# Patient Record
Sex: Female | Born: 1975 | Race: White | Hispanic: No | Marital: Married | Smoking: Never smoker
Health system: Southern US, Community
[De-identification: ages and names within clinical notes are randomized; demographics above are authoritative.]

## PROBLEM LIST (undated history)

## (undated) DIAGNOSIS — N939 Abnormal uterine and vaginal bleeding, unspecified: Secondary | ICD-10-CM

## (undated) HISTORY — DX: Abnormal uterine and vaginal bleeding, unspecified: N93.9

---
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
## 2001-04-04 HISTORY — PX: MASS EXCISION: SHX2000

## 2008-01-31 DIAGNOSIS — J45909 Unspecified asthma, uncomplicated: Secondary | ICD-10-CM | POA: Insufficient documentation

## 2008-04-28 ENCOUNTER — Ambulatory Visit: Payer: Self-pay

## 2008-04-29 ENCOUNTER — Inpatient Hospital Stay: Payer: Self-pay

## 2008-12-05 HISTORY — PX: ABDOMINAL HYSTERECTOMY: SHX81

## 2009-09-08 ENCOUNTER — Ambulatory Visit: Payer: Self-pay | Admitting: Family Medicine

## 2009-10-20 ENCOUNTER — Ambulatory Visit: Payer: Self-pay | Admitting: Orthopedic Surgery

## 2009-11-02 ENCOUNTER — Ambulatory Visit: Payer: Self-pay

## 2009-11-12 ENCOUNTER — Ambulatory Visit: Payer: Self-pay

## 2009-12-28 ENCOUNTER — Ambulatory Visit: Payer: Self-pay | Admitting: Orthopedic Surgery

## 2010-02-23 ENCOUNTER — Encounter: Payer: Self-pay | Admitting: Orthopedic Surgery

## 2010-03-05 ENCOUNTER — Encounter: Payer: Self-pay | Admitting: Orthopedic Surgery

## 2011-05-13 ENCOUNTER — Ambulatory Visit: Payer: Self-pay | Admitting: Internal Medicine

## 2011-08-29 LAB — CBC AND DIFFERENTIAL
HEMATOCRIT: 40 % (ref 36–46)
Hemoglobin: 13.5 g/dL (ref 12.0–16.0)
PLATELETS: 381 10*3/uL (ref 150–399)
WBC: 9.7 10*3/mL

## 2011-08-30 LAB — TSH: TSH: 1.22 u[IU]/mL (ref 0.41–5.90)

## 2011-08-30 LAB — LIPID PANEL
CHOLESTEROL: 154 mg/dL (ref 0–200)
HDL: 45 mg/dL (ref 35–70)
LDL CALC: 91 mg/dL
TRIGLYCERIDES: 90 mg/dL (ref 40–160)

## 2011-08-30 LAB — BASIC METABOLIC PANEL
BUN: 14 mg/dL (ref 4–21)
Creatinine: 0.5 mg/dL (ref 0.5–1.1)
Glucose: 81 mg/dL
Potassium: 3.9 mmol/L (ref 3.4–5.3)
Sodium: 137 mmol/L (ref 137–147)

## 2011-08-30 LAB — HEPATIC FUNCTION PANEL
ALT: 18 U/L (ref 7–35)
AST: 14 U/L (ref 13–35)

## 2011-09-26 ENCOUNTER — Ambulatory Visit: Payer: Self-pay

## 2012-12-26 ENCOUNTER — Ambulatory Visit: Payer: Self-pay | Admitting: Family Medicine

## 2013-02-06 ENCOUNTER — Ambulatory Visit: Payer: Self-pay | Admitting: Family Medicine

## 2013-03-17 ENCOUNTER — Ambulatory Visit: Payer: Self-pay | Admitting: Physician Assistant

## 2013-03-18 ENCOUNTER — Ambulatory Visit: Payer: Self-pay | Admitting: Otolaryngology

## 2014-09-15 ENCOUNTER — Ambulatory Visit: Payer: Self-pay | Admitting: Family Medicine

## 2015-07-14 ENCOUNTER — Other Ambulatory Visit: Payer: Self-pay | Admitting: Unknown Physician Specialty

## 2015-07-14 DIAGNOSIS — N63 Unspecified lump in unspecified breast: Secondary | ICD-10-CM

## 2015-07-17 ENCOUNTER — Ambulatory Visit
Admission: RE | Admit: 2015-07-17 | Discharge: 2015-07-17 | Disposition: A | Discharge status: Home / Self Care | Payer: BC Managed Care – PPO | Source: Ambulatory Visit | Attending: Unknown Physician Specialty | Admitting: Unknown Physician Specialty

## 2015-07-17 ENCOUNTER — Ambulatory Visit: Payer: Self-pay

## 2015-07-17 DIAGNOSIS — N6002 Solitary cyst of left breast: Secondary | ICD-10-CM | POA: Insufficient documentation

## 2015-07-17 DIAGNOSIS — N63 Unspecified lump in unspecified breast: Secondary | ICD-10-CM

## 2015-07-23 ENCOUNTER — Encounter: Payer: Self-pay | Admitting: *Deleted

## 2015-07-23 ENCOUNTER — Ambulatory Visit: Payer: Self-pay | Admitting: General Surgery

## 2015-10-23 ENCOUNTER — Encounter: Payer: Self-pay | Admitting: Family Medicine

## 2015-10-23 ENCOUNTER — Ambulatory Visit (INDEPENDENT_AMBULATORY_CARE_PROVIDER_SITE_OTHER): Payer: BC Managed Care – PPO | Admitting: Family Medicine

## 2015-10-23 VITALS — BP 116/68 | HR 84 | Temp 98.5°F | Resp 16 | Ht 61.0 in | Wt 211.0 lb

## 2015-10-23 DIAGNOSIS — R05 Cough: Secondary | ICD-10-CM

## 2015-10-23 DIAGNOSIS — J0110 Acute frontal sinusitis, unspecified: Secondary | ICD-10-CM

## 2015-10-23 DIAGNOSIS — G43909 Migraine, unspecified, not intractable, without status migrainosus: Secondary | ICD-10-CM | POA: Insufficient documentation

## 2015-10-23 DIAGNOSIS — T7840XA Allergy, unspecified, initial encounter: Secondary | ICD-10-CM | POA: Insufficient documentation

## 2015-10-23 DIAGNOSIS — E669 Obesity, unspecified: Secondary | ICD-10-CM | POA: Insufficient documentation

## 2015-10-23 DIAGNOSIS — B029 Zoster without complications: Secondary | ICD-10-CM | POA: Insufficient documentation

## 2015-10-23 DIAGNOSIS — R059 Cough, unspecified: Secondary | ICD-10-CM

## 2015-10-23 MED ORDER — HYDROCODONE-HOMATROPINE 5-1.5 MG/5ML PO SYRP: 5.0000 mL | ORAL_SOLUTION | Freq: Four times a day (QID) | ORAL | Status: DC | PRN

## 2015-10-23 NOTE — Progress Notes (Signed)
Patient ID: Erica Cook, female   DOB: December 16, 1975, 39 y.o.   MRN: [id number]         Patient: Erica Cook Female    DOB: 06/16/1976   39 y.o.   MRN: [id number] Visit Date: 10/23/2015  Today's Provider: Margarita Rana, MD   Chief Complaint  Patient presents with  . Sinusitis   Subjective:    Sinusitis This is a new problem. The current episode started in the past 7 days. The problem has been gradually worsening since onset. There has been no fever. Associated symptoms include congestion, coughing, headaches, sinus pressure, sneezing and a sore throat. Pertinent negatives include no chills, diaphoresis, ear pain, neck pain, shortness of breath or swollen glands. Past treatments include spray decongestants and oral decongestants. The treatment provided mild relief.       Allergies  Allergen Reactions  . Milk Protein Swelling  . Shellfish Allergy Swelling  . Wheat Bran Swelling   Previous Medications   EPINEPHRINE (EPIPEN 2-PAK) 0.3 MG/0.3 ML IJ SOAJ INJECTION    EPIPEN 2-PAK, 0.3MG /0.3ML (Injection Solution Auto-injector)  1 (one) Soln Auto-inj Soln Auto-inj as directed. for 0 days  Quantity: 2;  Refills: 2   Ordered :11-Dec-2014  [name] ;  Started 11-Dec-2014 Active   FEXOFENADINE [facility] ALLERGY) 180 MG TABLET    Take by mouth.   FLUTICASONE (FLONASE) 50 MCG/ACT NASAL SPRAY    Place into the nose.   HYDROCODONE-HOMATROPINE (HYCODAN) 5-1.5 MG/5ML SYRUP    Take 5 mLs by mouth every 6 (six) hours as needed for cough.   MONTELUKAST (SINGULAIR) 10 MG TABLET    Take by mouth.    Review of Systems  Constitutional: Positive for fatigue. Negative for fever, chills, diaphoresis, activity change, appetite change and unexpected weight change.  HENT: Positive for congestion, ear discharge, postnasal drip, rhinorrhea, sinus pressure, sneezing, sore throat, tinnitus and voice change. Negative for ear pain, facial swelling, nosebleeds and trouble swallowing.   Eyes: Negative for  photophobia, pain, discharge, redness, itching and visual disturbance.  Respiratory: Positive for cough and wheezing (Pt noticed it yesterday. ). Negative for apnea, choking, chest tightness, shortness of breath and stridor.   Cardiovascular: Negative.   Gastrointestinal: Negative.   Musculoskeletal: Negative for neck pain.  Neurological: Positive for headaches. Negative for dizziness and light-headedness.    Social History  Substance Use Topics  . Smoking status: Never Smoker   . Smokeless tobacco: Never Used  . Alcohol Use: No   Objective:   BP 116/68 mmHg  Pulse 84  Temp(Src) 98.5 F (36.9 C) (Oral)  Resp 16  Ht 5\' 1"  (1.549 m)  Wt 211 lb (95.709 kg)  BMI 39.89 kg/m2  SpO2 97%  Physical Exam  Constitutional: She is oriented to person, place, and time. She appears well-developed and well-nourished.  HENT:  Head: Normocephalic and atraumatic.  Right Ear: External ear normal.  Left Ear: External ear normal.  Nose: Mucosal edema and rhinorrhea present.  Mouth/Throat: Oropharynx is clear and moist.  Eyes: Conjunctivae and EOM are normal. Pupils are equal, round, and reactive to light.  Neck: Normal range of motion. Neck supple.  Cardiovascular: Normal rate and regular rhythm.   Pulmonary/Chest: Effort normal and breath sounds normal.  Neurological: She is alert and oriented to person, place, and time.  Psychiatric: She has a normal mood and affect. Her behavior is normal. Judgment and thought content normal.       Assessment & Plan:     1. Acute frontal  sinusitis, recurrence not specified New problem. Unclear if viral or bacterial.  Gave rx  For Augmentin to fill if needed.  Will call if starts rx and will add to her chart.   2. Cough Condition is worsening. Will start medication for better control.  Patient instructed to call back if condition worsens or does not improve.    - HYDROcodone-homatropine (HYCODAN) 5-1.5 MG/5ML syrup; Take 5 mLs by mouth every 6 (six) hours  as needed for cough.  Dispense: 120 mL; Refill: 0       Margarita Rana, MD  [facility]

## 2016-03-28 ENCOUNTER — Other Ambulatory Visit: Payer: Self-pay | Admitting: Family Medicine

## 2016-03-28 DIAGNOSIS — R059 Cough, unspecified: Secondary | ICD-10-CM

## 2016-03-28 DIAGNOSIS — R05 Cough: Secondary | ICD-10-CM

## 2016-03-28 MED ORDER — ALBUTEROL SULFATE HFA 108 (90 BASE) MCG/ACT IN AERS: 2.0000 | INHALATION_SPRAY | Freq: Four times a day (QID) | RESPIRATORY_TRACT | Status: DC | PRN

## 2016-03-28 MED ORDER — HYDROCODONE-HOMATROPINE 5-1.5 MG/5ML PO SYRP: 5.0000 mL | ORAL_SOLUTION | Freq: Four times a day (QID) | ORAL | Status: DC | PRN

## 2016-03-28 NOTE — Telephone Encounter (Signed)
Sent inhaler. Ok to pick up cough rx. Needs to use sparingly, can be addictive. Thanks.

## 2016-03-28 NOTE — Telephone Encounter (Signed)
Pt also called in for the refill on her cough rx.  She also would like an emergency inhaler.  Hers has expired.  She uses Oncologist in [place]

## 2016-08-01 ENCOUNTER — Other Ambulatory Visit: Payer: Self-pay | Admitting: Family Medicine

## 2016-08-01 DIAGNOSIS — R059 Cough, unspecified: Secondary | ICD-10-CM

## 2016-08-01 DIAGNOSIS — R05 Cough: Secondary | ICD-10-CM

## 2016-08-02 NOTE — Telephone Encounter (Signed)
This cough medication is a schedule II narcotic. It cannot prescribed without patient being seen and examined.

## 2016-10-17 ENCOUNTER — Other Ambulatory Visit: Payer: Self-pay | Admitting: Family Medicine

## 2016-10-17 ENCOUNTER — Encounter: Payer: Self-pay | Admitting: Physician Assistant

## 2016-10-17 ENCOUNTER — Ambulatory Visit (INDEPENDENT_AMBULATORY_CARE_PROVIDER_SITE_OTHER): Payer: BC Managed Care – PPO | Admitting: Physician Assistant

## 2016-10-17 VITALS — BP 120/78 | HR 85 | Temp 98.0°F | Resp 16 | Wt 221.0 lb

## 2016-10-17 DIAGNOSIS — J019 Acute sinusitis, unspecified: Secondary | ICD-10-CM | POA: Diagnosis not present

## 2016-10-17 DIAGNOSIS — M778 Other enthesopathies, not elsewhere classified: Secondary | ICD-10-CM | POA: Diagnosis not present

## 2016-10-17 MED ORDER — AMOXICILLIN-POT CLAVULANATE 875-125 MG PO TABS: 1.0000 | ORAL_TABLET | Freq: Two times a day (BID) | ORAL | 0 refills | Status: AC

## 2016-10-17 MED ORDER — AMOXICILLIN-POT CLAVULANATE 875-125 MG PO TABS: 1.0000 | ORAL_TABLET | Freq: Two times a day (BID) | ORAL | 0 refills | Status: DC

## 2016-10-17 NOTE — Progress Notes (Signed)
Patient: Erica Cook Female    DOB: March 30, 1976   40 y.o.   MRN: [id number] Visit Date: 10/17/2016  Today's Provider: Trinna Post, PA-C   Chief Complaint  Patient presents with  . Elbow Pain   Subjective:    HPI Elbow Pain: Patient complains of left elbow pain. Onset of the symptoms was yesterday. Inciting event: none known. Current symptoms include pain radiating to the hand. Pain is aggravated by: grasping. There is pain in her lateral elbow upon grasping. Patient's overall course: unchanged. Patient has had no prior elbow problems. No numbness or tingling, no weakness. No problems in her right elbow. Has not tried anything for it.   Upper Respiratory Infection: Patient complains of symptoms of a URI. Symptoms include bilateral ear drainage  and congestion. Onset of symptoms was 4 days ago, gradually worsening since that time. She also c/o congestion, nasal congestion, no  fever, post nasal drip and productive cough with  yellow and green colored sputum for the past 4 days .  She is drinking plenty of fluids. Patient reports she usually gets sinus infection this time of year.       Allergies  Allergen Reactions  . Milk Protein Swelling  . Shellfish Allergy Swelling  . Wheat Bran Swelling     Current Outpatient Prescriptions:  .  albuterol (PROVENTIL HFA;VENTOLIN HFA) 108 (90 Base) MCG/ACT inhaler, Inhale 2 puffs into the lungs every 6 (six) hours as needed for wheezing or shortness of breath., Disp: 1 Inhaler, Rfl: 2 .  EPINEPHrine (EPIPEN 2-PAK) 0.3 mg/0.3 mL IJ SOAJ injection, EPIPEN 2-PAK, 0.3MG /0.3ML (Injection Solution Auto-injector)  1 (one) Soln Auto-inj Soln Auto-inj as directed. for 0 days  Quantity: 2;  Refills: 2   Ordered :11-Dec-2014  [name] ;  Started 11-Dec-2014 Active, Disp: , Rfl:  .  fexofenadine (ALLEGRA ALLERGY) 180 MG tablet, Take by mouth., Disp: , Rfl:  .  montelukast (SINGULAIR) 10 MG tablet, Take by mouth., Disp: , Rfl:  .  fluticasone  (FLONASE) 50 MCG/ACT nasal spray, Place into the nose., Disp: , Rfl:  .  HYDROcodone-homatropine (HYCODAN) 5-1.5 MG/5ML syrup, Take 5 mLs by mouth every 6 (six) hours as needed for cough. (Patient not taking: Reported on 10/17/2016), Disp: 120 mL, Rfl: 0  Review of Systems  Constitutional: Negative for chills and fever.  HENT: Positive for congestion, ear discharge, postnasal drip, rhinorrhea, sinus pain, sinus pressure and voice change.   Respiratory: Positive for cough and wheezing (some Thursday and Friday). Negative for choking and chest tightness.   Cardiovascular: Negative for chest pain, palpitations and leg swelling.  Neurological: Positive for headaches. Negative for dizziness and light-headedness.    Social History  Substance Use Topics  . Smoking status: Never Smoker  . Smokeless tobacco: Never Used  . Alcohol use No   Objective:   BP 120/78 (BP Location: Right Arm, Patient Position: Sitting, Cuff Size: Normal)   Pulse 85   Temp 98 F (36.7 C) (Oral)   Resp 16   Wt 221 lb (100.2 kg)   SpO2 98%   BMI 41.76 kg/m   Physical Exam  Constitutional: She is oriented to person, place, and time. She appears well-developed and well-nourished. No distress.  HENT:  Right Ear: External ear normal.  Left Ear: External ear normal.  Nose: Right sinus exhibits no maxillary sinus tenderness and no frontal sinus tenderness. Left sinus exhibits no maxillary sinus tenderness and no frontal sinus tenderness.  Mouth/Throat: Oropharynx  is clear and moist. No oropharyngeal exudate, posterior oropharyngeal edema or posterior oropharyngeal erythema.  Tms opaque bilaterally   Eyes: Conjunctivae are normal. Right eye exhibits no discharge. Left eye exhibits no discharge.  Neck: Neck supple.  Cardiovascular: Normal rate and regular rhythm.   Pulmonary/Chest: Effort normal and breath sounds normal. No respiratory distress. She has no wheezes. She has no rales.  Musculoskeletal:       Right  shoulder: Normal.       Left shoulder: Normal.       Right elbow: Tenderness found.       Left elbow: Normal.       Right wrist: Normal.       Left wrist: Normal.  Bilateral 5/5 upper extremity strength. Distal sensation grossly in tact. Pain in left lateral elbow with grasping motion. Tenderness to palpation in left lateral elbow region.   Lymphadenopathy:    She has no cervical adenopathy.  Neurological: She is alert and oriented to person, place, and time.  Skin: Skin is warm and dry. She is not diaphoretic.  Psychiatric: She has a normal mood and affect. Her behavior is normal.        Assessment & Plan:      Problem List Items Addressed This Visit    None    Visit Diagnoses    Acute non-recurrent sinusitis, unspecified location    -  Primary   Relevant Medications   amoxicillin-clavulanate (AUGMENTIN) 875-125 MG tablet   Tendonitis of elbow, left         Patient given Augmentin hard script if after four more days of symptoms she continues to worsen.   Patient presenting with tendonitis of her elbow. Instructed to ice and use NSAID consistently for 1-2 weeks. Not necessary to immobilize. Follow up if no improvement.   Patient Instructions  Sinusitis, Adult Sinusitis is redness, soreness, and inflammation of the paranasal sinuses. Paranasal sinuses are air pockets within the bones of your face. They are located beneath your eyes, in the middle of your forehead, and above your eyes. In healthy paranasal sinuses, mucus is able to drain out, and air is able to circulate through them by way of your nose. However, when your paranasal sinuses are inflamed, mucus and air can become trapped. This can allow bacteria and other germs to grow and cause infection. Sinusitis can develop quickly and last only a short time (acute) or continue over a long period (chronic). Sinusitis that lasts for more than 12 weeks is considered chronic. CAUSES Causes of sinusitis  include:  Allergies.  Structural abnormalities, such as displacement of the cartilage that separates your nostrils (deviated septum), which can decrease the air flow through your nose and sinuses and affect sinus drainage.  Functional abnormalities, such as when the small hairs (cilia) that line your sinuses and help remove mucus do not work properly or are not present. SIGNS AND SYMPTOMS Symptoms of acute and chronic sinusitis are the same. The primary symptoms are pain and pressure around the affected sinuses. Other symptoms include:  Upper toothache.  Earache.  Headache.  Bad breath.  Decreased sense of smell and taste.  A cough, which worsens when you are lying flat.  Fatigue.  Fever.  Thick drainage from your nose, which often is green and may contain pus (purulent).  Swelling and warmth over the affected sinuses. DIAGNOSIS Your health care provider will perform a physical exam. During your exam, your health care provider may perform any of the following to help  determine if you have acute sinusitis or chronic sinusitis:  Look in your nose for signs of abnormal growths in your nostrils (nasal polyps).  Tap over the affected sinus to check for signs of infection.  View the inside of your sinuses using an imaging device that has a light attached (endoscope). If your health care provider suspects that you have chronic sinusitis, one or more of the following tests may be recommended:  Allergy tests.  Nasal culture. A sample of mucus is taken from your nose, sent to a lab, and screened for bacteria.  Nasal cytology. A sample of mucus is taken from your nose and examined by your health care provider to determine if your sinusitis is related to an allergy. TREATMENT Most cases of acute sinusitis are related to a viral infection and will resolve on their own within 10 days. Sometimes, medicines are prescribed to help relieve symptoms of both acute and chronic sinusitis.  These may include pain medicines, decongestants, nasal steroid sprays, or saline sprays. However, for sinusitis related to a bacterial infection, your health care provider will prescribe antibiotic medicines. These are medicines that will help kill the bacteria causing the infection. Rarely, sinusitis is caused by a fungal infection. In these cases, your health care provider will prescribe antifungal medicine. For some cases of chronic sinusitis, surgery is needed. Generally, these are cases in which sinusitis recurs more than 3 times per year, despite other treatments. HOME CARE INSTRUCTIONS  Drink plenty of water. Water helps thin the mucus so your sinuses can drain more easily.  Use a humidifier.  Inhale steam 3-4 times a day (for example, sit in the bathroom with the shower running).  Apply a warm, moist washcloth to your face 3-4 times a day, or as directed by your health care provider.  Use saline nasal sprays to help moisten and clean your sinuses.  Take medicines only as directed by your health care provider.  If you were prescribed either an antibiotic or antifungal medicine, finish it all even if you start to feel better. SEEK IMMEDIATE MEDICAL CARE IF:  You have increasing pain or severe headaches.  You have nausea, vomiting, or drowsiness.  You have swelling around your face.  You have vision problems.  You have a stiff neck.  You have difficulty breathing.   This information is not intended to replace advice given to you by your health care provider. Make sure you discuss any questions you have with your health care provider.   Document Released: 11/21/2005 Document Revised: 12/12/2014 Document Reviewed: 12/06/2011 Elsevier Interactive Patient Education [employer].   Return if symptoms worsen or fail to improve.  The entirety of the information documented in the History of Present Illness, Review of Systems and Physical Exam were personally obtained by  me. Portions of this information were initially documented by Joseline and reviewed by me for thoroughness and accuracy.            Trinna Post, PA-C  [facility]

## 2016-10-17 NOTE — Patient Instructions (Signed)

## 2016-10-20 ENCOUNTER — Other Ambulatory Visit: Payer: Self-pay | Admitting: Physician Assistant

## 2016-10-20 NOTE — Telephone Encounter (Signed)
Patient has been advised. KW 

## 2016-10-20 NOTE — Telephone Encounter (Signed)
Pt was in Monday and was given an antibiotic but she thinks she needs the advair.  Dr. Venia Minks usually gave her samples but if you need to call one in she uses [facility] in [place].   Pt's call back is [phone]  Thanks Con Memos

## 2016-10-20 NOTE — Telephone Encounter (Signed)
Advair is a long term maintenance medication and will not be effective for short term symptoms. Patient may use her albuterol inhaler every four hours consistently for the next three days, as this will help acute symptoms.

## 2016-11-03 ENCOUNTER — Encounter: Payer: Self-pay | Admitting: Physician Assistant

## 2016-11-03 ENCOUNTER — Ambulatory Visit
Admission: RE | Admit: 2016-11-03 | Discharge: 2016-11-03 | Disposition: A | Discharge status: Home / Self Care | Payer: BC Managed Care – PPO | Source: Ambulatory Visit | Attending: Physician Assistant | Admitting: Physician Assistant

## 2016-11-03 ENCOUNTER — Ambulatory Visit (INDEPENDENT_AMBULATORY_CARE_PROVIDER_SITE_OTHER): Payer: BC Managed Care – PPO | Admitting: Physician Assistant

## 2016-11-03 VITALS — BP 132/80 | HR 84 | Temp 98.5°F | Resp 16 | Wt 222.0 lb

## 2016-11-03 DIAGNOSIS — M25562 Pain in left knee: Secondary | ICD-10-CM

## 2016-11-03 NOTE — Patient Instructions (Signed)

## 2016-11-03 NOTE — Progress Notes (Signed)
Patient: Erica Cook Female    DOB: 13-Jan-1976   40 y.o.   MRN: [id number] Visit Date: 11/03/2016  Today's Provider: Trinna Post, PA-C   Chief Complaint  Patient presents with  . Knee Pain    Left knee pain.    Subjective:    Knee Pain   The incident occurred more than 1 week ago. There was no injury mechanism. The pain is present in the left knee. The quality of the pain is described as aching. The pain is at a severity of 3/10 (Can be as high as 6/10). Associated symptoms include muscle weakness (Only happened once. ). Pertinent negatives include no inability to bear weight, loss of motion, loss of sensation, numbness or tingling. Exacerbated by: Worse when sitting for a little while and then stands up. She has tried NSAIDs for the symptoms. The treatment provided mild relief.   The patient describes improvement in pain with standing. She has been using Ibuprofen 200 mg as needed.     Allergies  Allergen Reactions  . Milk Protein Swelling  . Shellfish Allergy Swelling  . Wheat Bran Swelling     Current Outpatient Prescriptions:  .  albuterol (PROVENTIL HFA;VENTOLIN HFA) 108 (90 Base) MCG/ACT inhaler, Inhale 2 puffs into the lungs every 6 (six) hours as needed for wheezing or shortness of breath., Disp: 1 Inhaler, Rfl: 2 .  EPINEPHRINE 0.3 mg/0.3 mL IJ SOAJ injection, AS DIRECTED, Disp: 2 Device, Rfl: 0 .  fexofenadine (ALLEGRA ALLERGY) 180 MG tablet, Take by mouth., Disp: , Rfl:  .  fluticasone (FLONASE) 50 MCG/ACT nasal spray, Place into the nose., Disp: , Rfl:  .  montelukast (SINGULAIR) 10 MG tablet, Take by mouth., Disp: , Rfl:   Review of Systems  Constitutional: Negative.   Musculoskeletal: Positive for arthralgias. Negative for back pain, gait problem, joint swelling, myalgias, neck pain and neck stiffness.  Neurological: Negative for tingling and numbness.    Social History  Substance Use Topics  . Smoking status: Never Smoker  . Smokeless  tobacco: Never Used  . Alcohol use No   Objective:   BP 132/80 (BP Location: Left Arm, Patient Position: Sitting, Cuff Size: Large)   Pulse 84   Temp 98.5 F (36.9 C) (Oral)   Resp 16   Wt 222 lb (100.7 kg)   BMI 41.95 kg/m   Physical Exam  Constitutional: She is oriented to person, place, and time. She appears well-developed and well-nourished.  Cardiovascular: Normal rate.   Pulmonary/Chest: Effort normal.  Musculoskeletal: Normal range of motion. She exhibits edema and tenderness. She exhibits no deformity.       Right knee: Normal.       Left knee: She exhibits LCL laxity. She exhibits normal range of motion, no swelling, no effusion, no ecchymosis, no deformity, no laceration, no erythema, normal alignment, normal patellar mobility, no bony tenderness, normal meniscus and no MCL laxity. Tenderness found. LCL tenderness noted. No medial joint line, no lateral joint line, no MCL and no patellar tendon tenderness noted.  Left ankle is mildly edematous, non-pitting.  Neurological: She is alert and oriented to person, place, and time.  Skin: Skin is warm and dry.  Psychiatric: She has a normal mood and affect. Her behavior is normal.        Assessment & Plan:      Problem List Items Addressed This Visit    None    Visit Diagnoses    Acute pain  of left knee    -  Primary   Relevant Orders   DG Knee Complete 4 Views Left     Patient is 40 y/o female with left knee pain ongoing for two weeks. No mechanism of injury. Knee with some LCL tenderness on exam but otherwise normal. Will send for Knee xray today. Pain relief with 400-600 mg ibuprofen TID for one week consistently. Activity as tolerated and ice as needed. If pain persists will refer to ortho.  Return if symptoms worsen or fail to improve.  Patient Instructions  Knee Pain Knee pain is a very common symptom and can have many causes. Knee pain often goes away when you follow your health care provider's instructions for  relieving pain and discomfort at home. However, knee pain can develop into a condition that needs treatment. Some conditions may include:  Arthritis caused by wear and tear (osteoarthritis).  Arthritis caused by swelling and irritation (rheumatoid arthritis or gout).  A cyst or growth in your knee.  An infection in your knee joint.  An injury that will not heal.  Damage, swelling, or irritation of the tissues that support your knee (torn ligaments or tendinitis). If your knee pain continues, additional tests may be ordered to diagnose your condition. Tests may include X-rays or other imaging studies of your knee. You may also need to have fluid removed from your knee. Treatment for ongoing knee pain depends on the cause, but treatment may include:  Medicines to relieve pain or swelling.  Steroid injections in your knee.  Physical therapy.  Surgery. HOME CARE INSTRUCTIONS  Take medicines only as directed by your health care provider.  Rest your knee and keep it raised (elevated) while you are resting.  Do not do things that cause or worsen pain.  Avoid high-impact activities or exercises, such as running, jumping rope, or doing jumping jacks.  Apply ice to the knee area:  Put ice in a plastic bag.  Place a towel between your skin and the bag.  Leave the ice on for 20 minutes, 2-3 times a day.  Ask your health care provider if you should wear an elastic knee support.  Keep a pillow under your knee when you sleep.  Lose weight if you are overweight. Extra weight can put pressure on your knee.  Do not use any tobacco products, including cigarettes, chewing tobacco, or electronic cigarettes. If you need help quitting, ask your health care provider. Smoking may slow the healing of any bone and joint problems that you may have. SEEK MEDICAL CARE IF:  Your knee pain continues, changes, or gets worse.  You have a fever along with knee pain.  Your knee buckles or locks  up.  Your knee becomes more swollen. SEEK IMMEDIATE MEDICAL CARE IF:   Your knee joint feels hot to the touch.  You have chest pain or trouble breathing. This information is not intended to replace advice given to you by your health care provider. Make sure you discuss any questions you have with your health care provider. Document Released: 09/18/2007 Document Revised: 12/12/2014 Document Reviewed: 07/07/2014 Elsevier Interactive Patient Education  2017 [employer].    The entirety of the information documented in the History of Present Illness, Review of Systems and Physical Exam were personally obtained by me. Portions of this information were initially documented by [name], CMA and reviewed by me for thoroughness and accuracy.          Trinna Post, PA-C  [facility]

## 2016-11-21 IMAGING — MG MM DIAG BREAST TOMO BILATERAL
8 of 13 series · 8 of 29 positions shown · non-contrast
Comparison: Previous exam(s).

CLINICAL DATA: Mass felt by the patient in the outer left breast
for the past 1.5 months.

EXAM:
DIGITAL DIAGNOSTIC BILATERAL MAMMOGRAM WITH 3D TOMOSYNTHESIS WITH
CAD
ULTRASOUND LEFT BREAST

[L CC (1 of 2)]
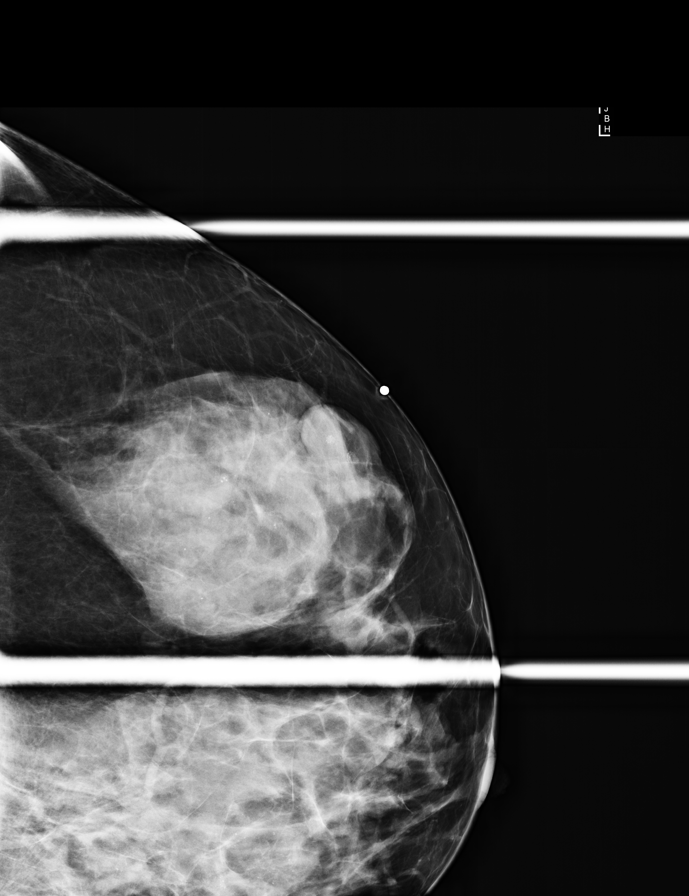

[L CC synth-2D]
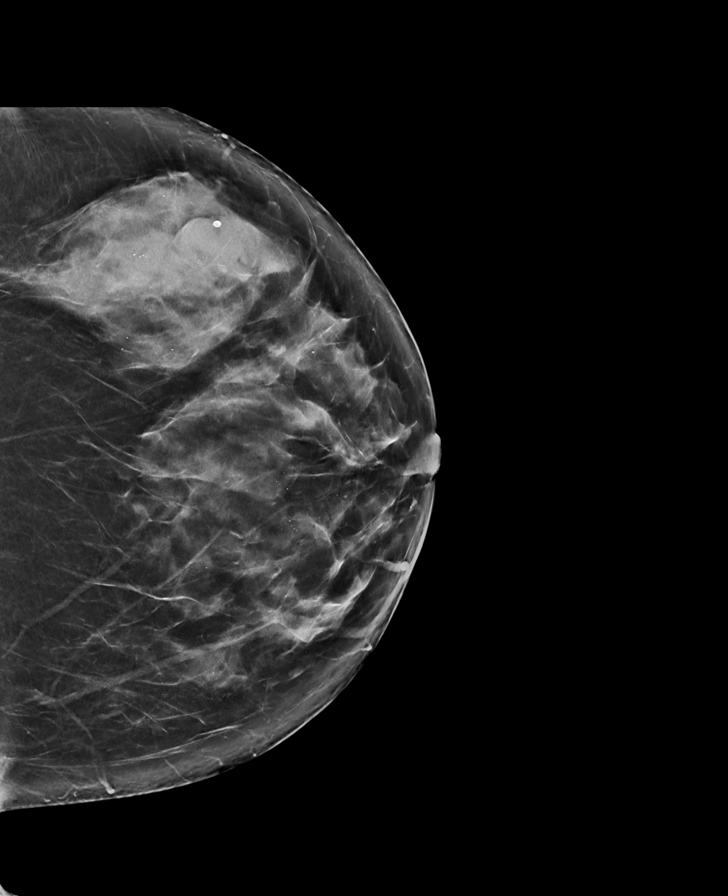

[R MLO]
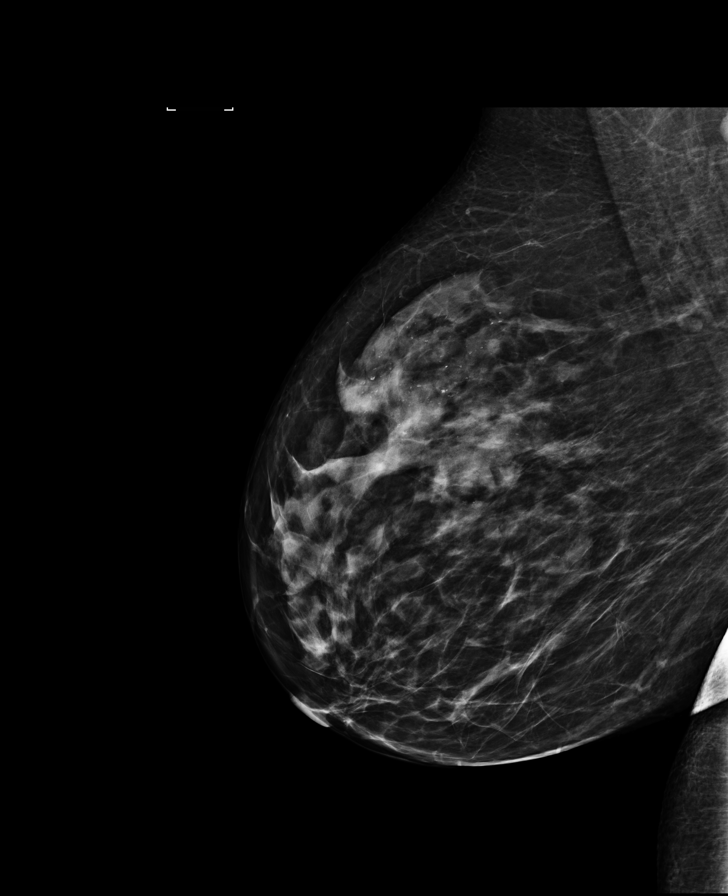

[L MLO]
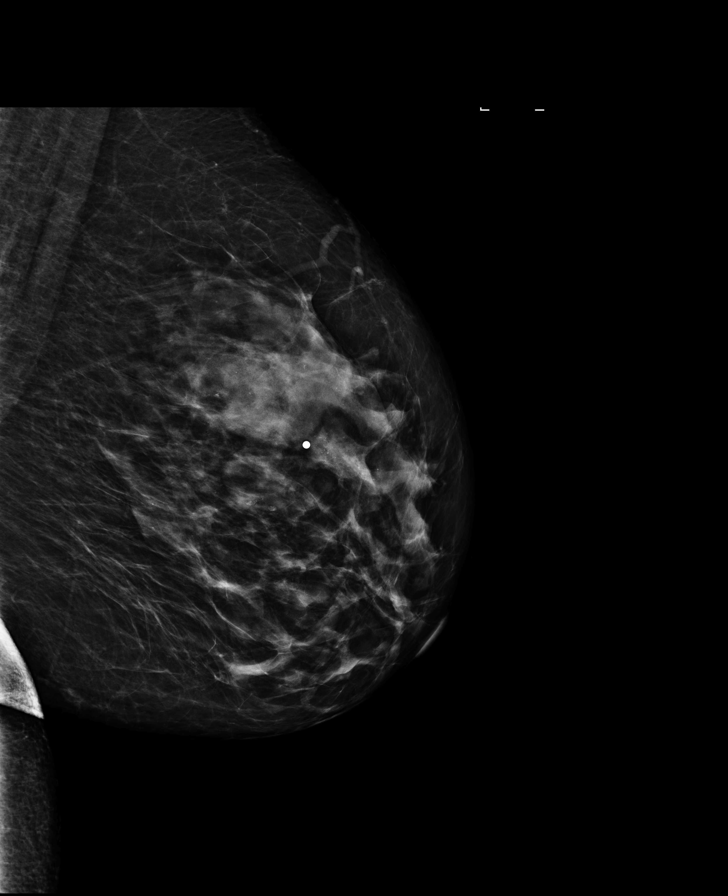

[R MLO synth-2D]
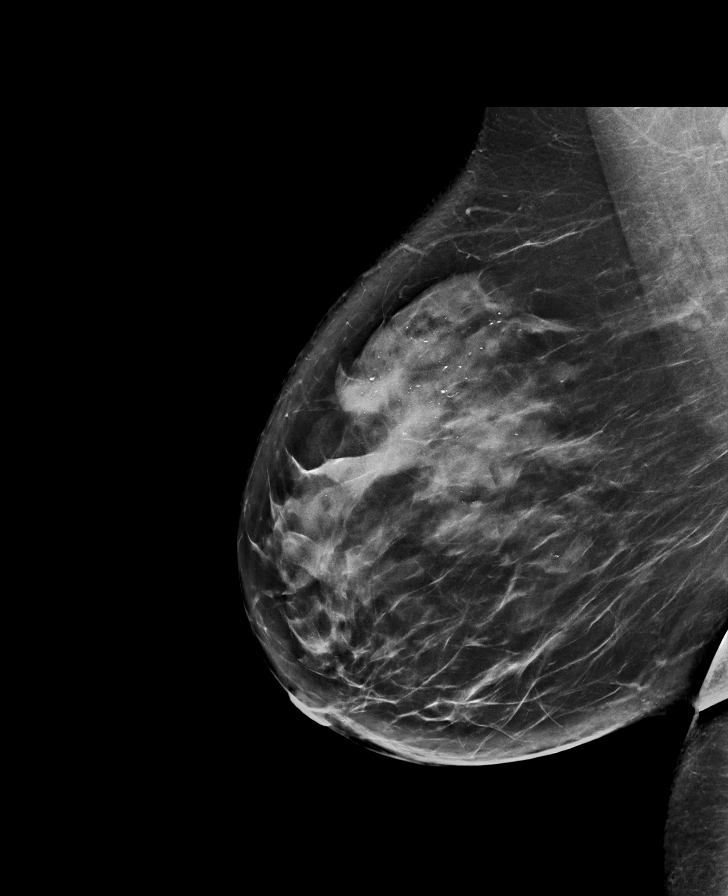

[R CC synth-2D]
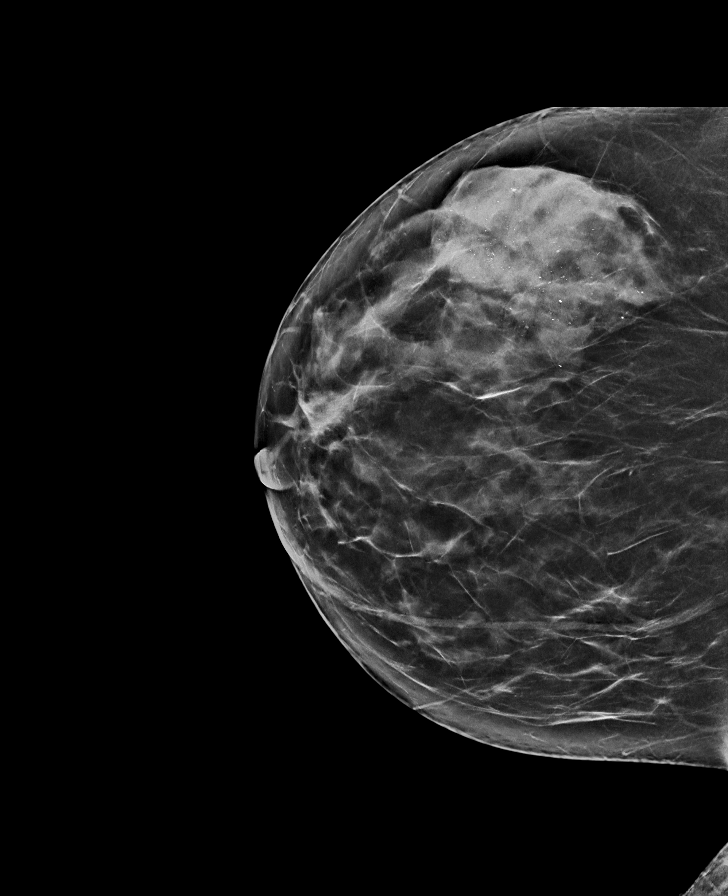

[L CC (2 of 2)]
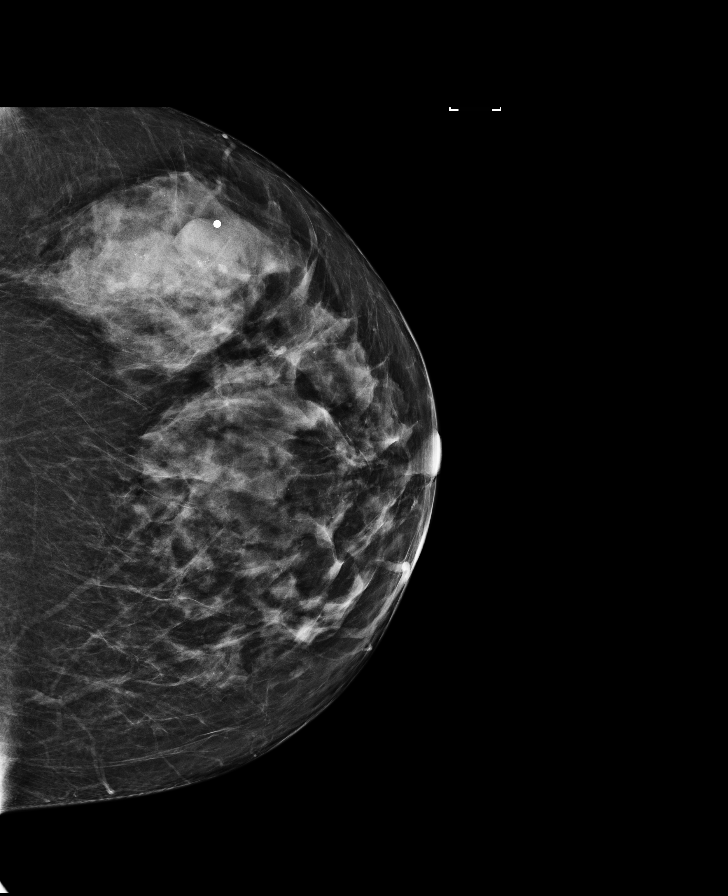

[L MLO synth-2D]
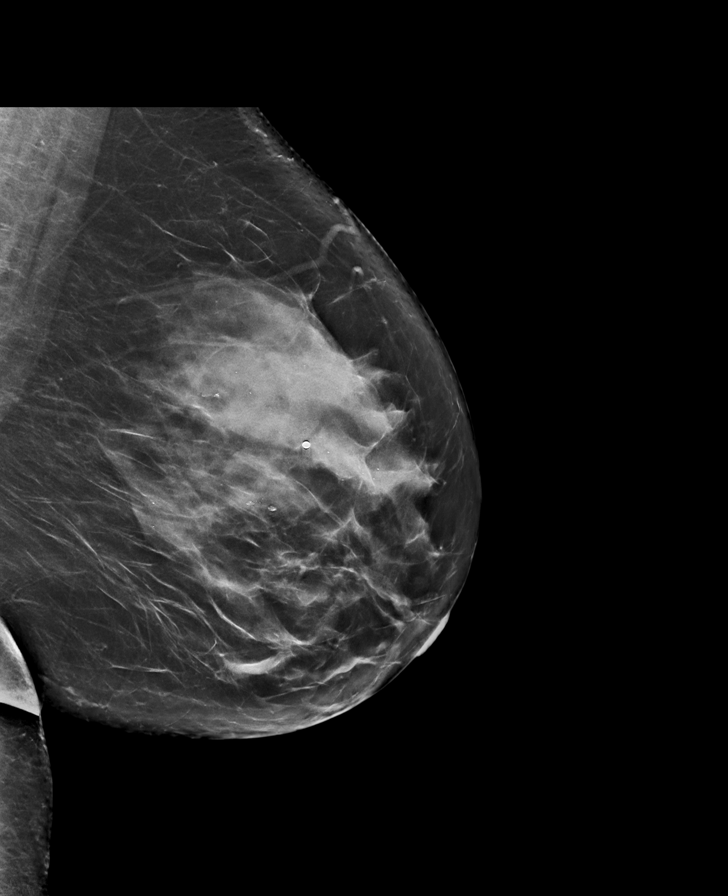

[8 of 29 positions shown; findings below may reference images not displayed]

ACR Breast Density Category c: The breast tissue is heterogeneously
dense, which may obscure small masses.
FINDINGS: 3D tomographic images of the left breast demonstrate an oval,
partially obscured mass within dense glandular tissue in the outer
left breast, corresponding to the mass felt by the patient. The
visualized margins are circumscribed. Bilateral breast
calcifications in dense glandular tissue are demonstrated with some
dependent layering, compatible with benign milk of calcium.

Mammographic images were processed with CAD.

On physical exam, the patient has an approximately 1.5 cm oval,
circumscribed, palpable mass in the 3 o'clock position of the left
breast, 7 cm from the nipple.

Targeted ultrasound is performed, showing a 2.1 cm simple cyst in
the 3 o'clock position of the left breast, 7 cm from the nipple,
corresponding to the palpable mass. There is also a nearby 2.1 cm
simple cyst and a smaller cyst.
IMPRESSION: Left breast cysts.  No evidence of malignancy.

RECOMMENDATION:
Bilateral screening mammogram in 1 year.

I have discussed the findings and recommendations with the patient.
Results were also provided in writing at the conclusion of the
visit. If applicable, a reminder letter will be sent to the patient
regarding the next appointment.

BI-RADS CATEGORY  2: Benign.

## 2016-11-29 ENCOUNTER — Other Ambulatory Visit: Payer: Self-pay | Admitting: Pediatrics

## 2016-11-29 DIAGNOSIS — Z1231 Encounter for screening mammogram for malignant neoplasm of breast: Secondary | ICD-10-CM

## 2016-12-13 ENCOUNTER — Ambulatory Visit
Admission: RE | Admit: 2016-12-13 | Discharge: 2016-12-13 | Disposition: A | Discharge status: Home / Self Care | Payer: BC Managed Care – PPO | Source: Ambulatory Visit | Attending: Pediatrics | Admitting: Pediatrics

## 2016-12-13 DIAGNOSIS — Z1231 Encounter for screening mammogram for malignant neoplasm of breast: Secondary | ICD-10-CM

## 2017-01-16 ENCOUNTER — Encounter: Payer: Self-pay | Admitting: Obstetrics and Gynecology

## 2017-01-16 ENCOUNTER — Ambulatory Visit (INDEPENDENT_AMBULATORY_CARE_PROVIDER_SITE_OTHER): Payer: BC Managed Care – PPO | Admitting: Obstetrics and Gynecology

## 2017-01-16 ENCOUNTER — Other Ambulatory Visit: Payer: Self-pay | Admitting: Obstetrics and Gynecology

## 2017-01-16 VITALS — BP 120/83 | HR 90 | Ht 61.0 in | Wt 217.4 lb

## 2017-01-16 DIAGNOSIS — N939 Abnormal uterine and vaginal bleeding, unspecified: Secondary | ICD-10-CM | POA: Diagnosis not present

## 2017-01-16 DIAGNOSIS — R102 Pelvic and perineal pain: Secondary | ICD-10-CM | POA: Diagnosis not present

## 2017-01-16 NOTE — Progress Notes (Signed)
HPI:      [name] is a 41 y.o. G2P2 who LMP was No LMP recorded. Patient has had a hysterectomy.  Subjective: She presents today stating that last Wednesday she had severe pelvic pain which caused her to leave work. This severe pain resolved fairly quickly and she has some lasting pressure symptoms. She says these are resolving as well. She reports that the pain was associated with some vaginal bleeding. Of significant note she states that she had a hysterectomy several years ago for bleeding.    Hx: The following portions of the patient's history were reviewed and updated as appropriate:           She  has a past medical history of Abnormal uterine bleeding. She  does not have any pertinent problems on file. She  has a past surgical history that includes Abdominal hysterectomy (2010); Mass excision (04/2001); Cesarean section (2002 & 2009); and Tonsillectomy (1996). Her family history includes Colon cancer in her paternal grandmother; Colon polyps in her father; Depression in her father; Diabetes in her father; Healthy in her brother and mother; Hypertension in her father. She  reports that she has never smoked. She has never used smokeless tobacco. She reports that she does not drink alcohol or use drugs. She has a current medication list which includes the following prescription(s): albuterol, epinephrine, fexofenadine, fluticasone, montelukast, and ibuprofen.        ROS: Constitutional: Denied constitutional symptoms, night sweats, recent illness, fatigue, fever, insomnia and weight loss.  Eyes: Denied eye symptoms, eye pain, photophobia, vision change and visual disturbance.  Ears/Nose/Throat/Neck: Denied ear, nose, throat or neck symptoms, hearing loss, nasal discharge, sinus congestion and sore throat.  Cardiovascular: Denied cardiovascular symptoms, arrhythmia, chest pain/pressure, edema, exercise intolerance, orthopnea and palpitations.  Respiratory: Denied pulmonary symptoms,  asthma, pleuritic pain, productive sputum, cough, dyspnea and wheezing.  Gastrointestinal: Denied, gastro-esophageal reflux, melena, nausea and vomiting.  Genitourinary: See HPI for additional information.  Musculoskeletal: Denied musculoskeletal symptoms, stiffness, swelling, muscle weakness and myalgia.  Dermatologic: Denied dermatology symptoms, rash and scar.  Neurologic: Denied neurology symptoms, dizziness, headache, neck pain and syncope.  Psychiatric: Denied psychiatric symptoms, anxiety and depression.  Endocrine: Denied endocrine symptoms including hot flashes and night sweats.   Meds: She has a current medication list which includes the following prescription(s): albuterol, epinephrine, fexofenadine, fluticasone, montelukast, and ibuprofen.  Objective: Vitals:   01/16/17 0815  BP: 120/83  Pulse: 90            Physical examination   Pelvic:   Vulva: Normal appearance.  No lesions.  Vagina: No lesions or abnormalities noted.  Support: Normal pelvic support.  Urethra No masses tenderness or scarring.  Meatus Normal size without lesions or prolapse.  Cervix: Normal appearance.  No lesions. Small amount of bleeding coming from cervical os.   Anus: Normal exam.  No lesions.  Perineum: Normal exam.  No lesions.        Bimanual   Uterus: Not palpated   Adnexae: No masses.  Non-tender to palpation.  Cul-de-sac: Negative for abnormality.    Assessment: 1. Pelvic pain in female   2. Vaginal bleeding     Patient likely had a true partial hysterectomy where the cervix was left. Her bleeding is likely secondary to some endocervical glandular bleeding. Possible ruptured ovarian cyst which has resolved.  Plan:             1.  Pelvic ultrasound for pelvic pain.   2.  Obtain old records regarding operative dictation/report regarding partial hysterectomy.  Orders No orders of the defined types were placed in this encounter.    Meds ordered this encounter  Medications  .  ibuprofen (ADVIL,MOTRIN) 200 MG tablet    Sig: Take by mouth.        F/U  Return in about 2 weeks (around 01/30/2017) for Pt to contact us if symptoms worsen.  Finis Bud, M.D. 01/16/2017 9:49 AM

## 2017-01-16 NOTE — Progress Notes (Signed)
New 41 yo patient presents with c/o spotting since partial hysterectomy done 11/13/2009. On 01/11/17 she had severe lower abd pain with vaginal bleeding.

## 2017-01-19 ENCOUNTER — Ambulatory Visit (INDEPENDENT_AMBULATORY_CARE_PROVIDER_SITE_OTHER): Payer: BC Managed Care – PPO

## 2017-01-19 DIAGNOSIS — R102 Pelvic and perineal pain: Secondary | ICD-10-CM | POA: Diagnosis not present

## 2017-01-26 ENCOUNTER — Ambulatory Visit (INDEPENDENT_AMBULATORY_CARE_PROVIDER_SITE_OTHER): Payer: BC Managed Care – PPO | Admitting: Obstetrics and Gynecology

## 2017-01-26 ENCOUNTER — Encounter: Payer: Self-pay | Admitting: Obstetrics and Gynecology

## 2017-01-26 VITALS — BP 111/74 | HR 90 | Ht 61.0 in | Wt 218.6 lb

## 2017-01-26 DIAGNOSIS — N939 Abnormal uterine and vaginal bleeding, unspecified: Secondary | ICD-10-CM

## 2017-01-26 DIAGNOSIS — R102 Pelvic and perineal pain: Secondary | ICD-10-CM | POA: Diagnosis not present

## 2017-01-26 NOTE — Progress Notes (Signed)
HPI:      [name] is a 41 y.o. G2P2 who LMP was No LMP recorded. Patient has had a hysterectomy.  Subjective:   She presents today For follow-up of her ultrasound for pelvic pain and vaginal bleeding. Her pain has resolved and her bleeding has resolved. Ultrasound reveals a normal pelvis with a previous supracervical hysterectomy. One of the ovaries is not visualized.    Hx: The following portions of the patient's history were reviewed and updated as appropriate:             She  has a past medical history of Abnormal uterine bleeding. She  does not have any pertinent problems on file. She  has a past surgical history that includes Abdominal hysterectomy (2010); Mass excision (04/2001); Cesarean section (2002 & 2009); and Tonsillectomy (1996). Her family history includes Colon cancer in her paternal grandmother; Colon polyps in her father; Depression in her father; Diabetes in her father; Healthy in her brother and mother; Hypertension in her father. She  reports that she has never smoked. She has never used smokeless tobacco. She reports that she does not drink alcohol or use drugs.       Review of Systems:  Review of Systems  Constitutional: Denied constitutional symptoms, night sweats, recent illness, fatigue, fever, insomnia and weight loss.  Eyes: Denied eye symptoms, eye pain, photophobia, vision change and visual disturbance.  Ears/Nose/Throat/Neck: Denied ear, nose, throat or neck symptoms, hearing loss, nasal discharge, sinus congestion and sore throat.  Cardiovascular: Denied cardiovascular symptoms, arrhythmia, chest pain/pressure, edema, exercise intolerance, orthopnea and palpitations.  Respiratory: Denied pulmonary symptoms, asthma, pleuritic pain, productive sputum, cough, dyspnea and wheezing.  Gastrointestinal: Denied, gastro-esophageal reflux, melena, nausea and vomiting.  Genitourinary: Denied genitourinary symptoms including symptomatic vaginal discharge, pelvic  relaxation issues, and urinary complaints.  Musculoskeletal: Denied musculoskeletal symptoms, stiffness, swelling, muscle weakness and myalgia.  Dermatologic: Denied dermatology symptoms, rash and scar.  Neurologic: Denied neurology symptoms, dizziness, headache, neck pain and syncope.  Psychiatric: Denied psychiatric symptoms, anxiety and depression.  Endocrine: Denied endocrine symptoms including hot flashes and night sweats.   Meds:   Current Outpatient Prescriptions on File Prior to Visit  Medication Sig Dispense Refill  . albuterol (PROVENTIL HFA;VENTOLIN HFA) 108 (90 Base) MCG/ACT inhaler Inhale 2 puffs into the lungs every 6 (six) hours as needed for wheezing or shortness of breath. 1 Inhaler 2  . EPINEPHRINE 0.3 mg/0.3 mL IJ SOAJ injection AS DIRECTED 2 Device 0  . fexofenadine (ALLEGRA ALLERGY) 180 MG tablet Take by mouth.    . fluticasone (FLONASE) 50 MCG/ACT nasal spray Place into the nose.    . ibuprofen (ADVIL,MOTRIN) 200 MG tablet Take by mouth.    . montelukast (SINGULAIR) 10 MG tablet Take by mouth.     No current facility-administered medications on file prior to visit.     Objective:     Vitals:   01/26/17 0806  BP: 111/74  Pulse: 90              Ultrasound results reviewed in detail with the patient.  Assessment:    G2P2 Patient Active Problem List   Diagnosis Date Noted  . Allergic reaction 10/23/2015  . Adiposity 10/23/2015  . Headache, migraine 10/23/2015  . 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 Allergic rhinitis 03/26/2010  . Asthma without status asthmaticus 01/31/2008  . Encounter for surveillance of contraceptive pills 10/06/2005     1. Pelvic pain in female   2. Vaginal bleeding  All records obtained and reviewed in detail. Patient had a previous supracervical hysterectomy by laparoscopy with morcellation of the uterus. There is no mention of oophorectomy.   Plan:            1.  We have discussed her supracervical hysterectomy in detail. The  possibility of cervical vaginal bleeding discussed. The need for future Pap smears discussed. All patient's questions were answered.       F/U  Return for Annual Physical. I spent 16 minutes with this patient of which greater than 50% was spent discussing her previous surgery, the reason for her vaginal bleeding, her ultrasound results.  Finis Bud, M.D. 01/26/2017 10:47 AM

## 2017-02-07 ENCOUNTER — Encounter: Payer: Self-pay | Admitting: Obstetrics and Gynecology

## 2017-02-07 ENCOUNTER — Other Ambulatory Visit: Payer: Self-pay | Admitting: Obstetrics and Gynecology

## 2017-02-07 ENCOUNTER — Ambulatory Visit (INDEPENDENT_AMBULATORY_CARE_PROVIDER_SITE_OTHER): Payer: BC Managed Care – PPO | Admitting: Obstetrics and Gynecology

## 2017-02-07 VITALS — BP 111/72 | HR 82 | Ht 63.0 in | Wt 223.6 lb

## 2017-02-07 DIAGNOSIS — Z01419 Encounter for gynecological examination (general) (routine) without abnormal findings: Secondary | ICD-10-CM | POA: Diagnosis not present

## 2017-02-07 DIAGNOSIS — E669 Obesity, unspecified: Secondary | ICD-10-CM | POA: Diagnosis not present

## 2017-02-07 NOTE — Progress Notes (Signed)
HPI:      [name] is a 41 y.o. G2P2 who LMP was No LMP recorded. Patient has had a hysterectomy.  Subjective:   She presents today for her annual examination.  She has had a supracervical hysterectomy and has spotting each month likely consistent with menses. Her pelvic pain is resolved. She has had a mammogram within the last few months and this is reported as normal.    Hx: The following portions of the patient's history were reviewed and updated as appropriate:              She  has a past medical history of Abnormal uterine bleeding. She  does not have any pertinent problems on file. She  has a past surgical history that includes Abdominal hysterectomy (2010); Mass excision (04/2001); Cesarean section (2002 & 2009); and Tonsillectomy (1996). Her family history includes Colon cancer in her paternal grandmother; Colon polyps in her father; Depression in her father; Diabetes in her father; Healthy in her brother and mother; Hypertension in her father. She  reports that she has never smoked. She has never used smokeless tobacco. She reports that she does not drink alcohol or use drugs. Current Outpatient Prescriptions on File Prior to Visit  Medication Sig Dispense Refill  . albuterol (PROVENTIL HFA;VENTOLIN HFA) 108 (90 Base) MCG/ACT inhaler Inhale 2 puffs into the lungs every 6 (six) hours as needed for wheezing or shortness of breath. 1 Inhaler 2  . EPINEPHRINE 0.3 mg/0.3 mL IJ SOAJ injection AS DIRECTED 2 Device 0  . fexofenadine (ALLEGRA ALLERGY) 180 MG tablet Take by mouth.    . fluticasone (FLONASE) 50 MCG/ACT nasal spray Place into the nose.    . ibuprofen (ADVIL,MOTRIN) 200 MG tablet Take by mouth.    . montelukast (SINGULAIR) 10 MG tablet Take by mouth.     No current facility-administered medications on file prior to visit.          Review of Systems:  Review of Systems  Constitutional: Denied constitutional symptoms, night sweats, recent illness, fatigue, fever,  insomnia and weight loss.  Eyes: Denied eye symptoms, eye pain, photophobia, vision change and visual disturbance.  Ears/Nose/Throat/Neck: Denied ear, nose, throat or neck symptoms, hearing loss, nasal discharge, sinus congestion and sore throat.  Cardiovascular: Denied cardiovascular symptoms, arrhythmia, chest pain/pressure, edema, exercise intolerance, orthopnea and palpitations.  Respiratory: Denied pulmonary symptoms, asthma, pleuritic pain, productive sputum, cough, dyspnea and wheezing.  Gastrointestinal: Denied, gastro-esophageal reflux, melena, nausea and vomiting.  Genitourinary: Denied genitourinary symptoms including symptomatic vaginal discharge, pelvic relaxation issues, and urinary complaints.  Musculoskeletal: Denied musculoskeletal symptoms, stiffness, swelling, muscle weakness and myalgia.  Dermatologic: Denied dermatology symptoms, rash and scar.  Neurologic: Denied neurology symptoms, dizziness, headache, neck pain and syncope.  Psychiatric: Denied psychiatric symptoms, anxiety and depression.  Endocrine: Denied endocrine symptoms including hot flashes and night sweats.   Meds:   Current Outpatient Prescriptions on File Prior to Visit  Medication Sig Dispense Refill  . albuterol (PROVENTIL HFA;VENTOLIN HFA) 108 (90 Base) MCG/ACT inhaler Inhale 2 puffs into the lungs every 6 (six) hours as needed for wheezing or shortness of breath. 1 Inhaler 2  . EPINEPHRINE 0.3 mg/0.3 mL IJ SOAJ injection AS DIRECTED 2 Device 0  . fexofenadine (ALLEGRA ALLERGY) 180 MG tablet Take by mouth.    . fluticasone (FLONASE) 50 MCG/ACT nasal spray Place into the nose.    . ibuprofen (ADVIL,MOTRIN) 200 MG tablet Take by mouth.    . montelukast (SINGULAIR) 10 MG  tablet Take by mouth.     No current facility-administered medications on file prior to visit.     Objective:     Vitals:   02/07/17 0814  BP: 111/72  Pulse: 82              Physical examination General NAD, Conversant  HEENT  Atraumatic; Op clear with mmm.  Normo-cephalic. Pupils reactive. Anicteric sclerae  Thyroid/Neck Smooth without nodularity or enlargement. Normal ROM.  Neck Supple.  Skin No rashes, lesions or ulceration. Normal palpated skin turgor. No nodularity.  Breasts: No masses or discharge.  Symmetric.  No axillary adenopathy.  Lungs: Clear to auscultation.No rales or wheezes. Normal Respiratory effort, no retractions.  Heart: NSR.  No murmurs or rubs appreciated. No periferal edema  Abdomen: Soft.  Non-tender.  No masses.  No HSM. No hernia  Extremities: Moves all appropriately.  Normal ROM for age. No lymphadenopathy.  Neuro: Oriented to PPT.  Normal mood. Normal affect.     Pelvic:   Vulva: Normal appearance.  No lesions.  Vagina: No lesions or abnormalities noted.  Support: Normal pelvic support.  Urethra No masses tenderness or scarring.  Meatus Normal size without lesions or prolapse.  Cervix: Normal appearance.  No lesions.  Mild to moderate stenosis.  Anus: Normal exam.  No lesions.  Perineum: Normal exam.  No lesions.        Bimanual   Uterus: Surgically absent   Adnexae: No masses.  Non-tender to palpation.  Cul-de-sac: Negative for abnormality.      Assessment:    G2P2 Patient Active Problem List   Diagnosis Date Noted  . Allergic reaction 10/23/2015  . Adiposity 10/23/2015  . Headache, migraine 10/23/2015  . 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 Allergic rhinitis 03/26/2010  . Asthma without status asthmaticus 01/31/2008  . Encounter for surveillance of contraceptive pills 10/06/2005     1. Encounter for annual routine gynecological examination   2. Obesity (BMI 35.0-39.9 without comorbidity)        Plan:            1.  She is now aware that she may have monthly menstrual periods despite having her uterus removed. I have offered to address this with her to control her bleeding if it lasted more than a few days per month. At this time she has no issues with it and desires no  further management.    F/U  Return in about 1 year (around 02/07/2018) for Annual Physical.  Finis Bud, M.D. 02/07/2017 4:58 PM

## 2017-02-10 ENCOUNTER — Telehealth: Payer: Self-pay

## 2017-02-10 LAB — PAP IG AND HPV HIGH-RISK
HPV, HIGH-RISK: NEGATIVE
PAP SMEAR COMMENT: 0

## 2017-02-10 NOTE — Telephone Encounter (Signed)
-----   Message from Harlin Heys, MD sent at 02/10/2017  8:19 AM EST ----- Consider  Negative with f/u 1 year.

## 2017-02-10 NOTE — Telephone Encounter (Signed)
Message left on pts voicemail- neg results per provider. 

## 2018-02-21 ENCOUNTER — Encounter: Payer: BC Managed Care – PPO | Admitting: Obstetrics and Gynecology

## 2018-03-15 ENCOUNTER — Ambulatory Visit (INDEPENDENT_AMBULATORY_CARE_PROVIDER_SITE_OTHER): Payer: BC Managed Care – PPO | Admitting: Obstetrics and Gynecology

## 2018-03-15 ENCOUNTER — Encounter: Payer: BC Managed Care – PPO | Admitting: Obstetrics and Gynecology

## 2018-03-15 ENCOUNTER — Encounter: Payer: Self-pay | Admitting: Obstetrics and Gynecology

## 2018-03-15 VITALS — BP 136/85 | HR 81 | Ht 63.0 in | Wt 233.1 lb

## 2018-03-15 DIAGNOSIS — Z01419 Encounter for gynecological examination (general) (routine) without abnormal findings: Secondary | ICD-10-CM | POA: Diagnosis not present

## 2018-03-15 DIAGNOSIS — L723 Sebaceous cyst: Secondary | ICD-10-CM

## 2018-03-15 NOTE — Progress Notes (Signed)
HPI:      [name] is a 42 y.o. G2P2 who LMP was No LMP recorded. Patient has had a hysterectomy.  Subjective:   She presents today for her annual examination.  She is doing well.  Still has occasional periods because of her retained cervix.  She is up-to-date on her mammogram and blood work screening. She does state that she has noticed 3 small bumps on her labia that are asymptomatic.    Hx: The following portions of the patient's history were reviewed and updated as appropriate:             She  has a past medical history of Abnormal uterine bleeding. She does not have any pertinent problems on file. She  has a past surgical history that includes Abdominal hysterectomy (2010); Mass excision (04/2001); Cesarean section (2002 & 2009); and Tonsillectomy (1996). Her family history includes Colon cancer in her paternal grandmother; Colon polyps in her father; Depression in her father; Diabetes in her father; Healthy in her brother and mother; Hypertension in her father. She  reports that she has never smoked. She has never used smokeless tobacco. She reports that she does not drink alcohol or use drugs. She has a current medication list which includes the following prescription(s): albuterol, epinephrine, fexofenadine, fluticasone, ibuprofen, and montelukast. She is allergic to fish allergy; milk protein; and wheat bran.       Review of Systems:  Review of Systems  Constitutional: Denied constitutional symptoms, night sweats, recent illness, fatigue, fever, insomnia and weight loss.  Eyes: Denied eye symptoms, eye pain, photophobia, vision change and visual disturbance.  Ears/Nose/Throat/Neck: Denied ear, nose, throat or neck symptoms, hearing loss, nasal discharge, sinus congestion and sore throat.  Cardiovascular: Denied cardiovascular symptoms, arrhythmia, chest pain/pressure, edema, exercise intolerance, orthopnea and palpitations.  Respiratory: Denied pulmonary symptoms, asthma,  pleuritic pain, productive sputum, cough, dyspnea and wheezing.  Gastrointestinal: Denied, gastro-esophageal reflux, melena, nausea and vomiting.  Genitourinary: Denied genitourinary symptoms including symptomatic vaginal discharge, pelvic relaxation issues, and urinary complaints.  Musculoskeletal: Denied musculoskeletal symptoms, stiffness, swelling, muscle weakness and myalgia.  Dermatologic: Denied dermatology symptoms, rash and scar.  Neurologic: Denied neurology symptoms, dizziness, headache, neck pain and syncope.  Psychiatric: Denied psychiatric symptoms, anxiety and depression.  Endocrine: Denied endocrine symptoms including hot flashes and night sweats.   Meds:   Current Outpatient Medications on File Prior to Visit  Medication Sig Dispense Refill  . albuterol (PROVENTIL HFA;VENTOLIN HFA) 108 (90 Base) MCG/ACT inhaler Inhale 2 puffs into the lungs every 6 (six) hours as needed for wheezing or shortness of breath. 1 Inhaler 2  . EPINEPHRINE 0.3 mg/0.3 mL IJ SOAJ injection AS DIRECTED 2 Device 0  . fexofenadine (ALLEGRA ALLERGY) 180 MG tablet Take by mouth.    . fluticasone (FLONASE) 50 MCG/ACT nasal spray Place into the nose.    . ibuprofen (ADVIL,MOTRIN) 200 MG tablet Take by mouth.    . montelukast (SINGULAIR) 10 MG tablet Take by mouth.     No current facility-administered medications on file prior to visit.     Objective:     Vitals:   03/15/18 0855  BP: 136/85  Pulse: 81              Physical examination General NAD, Conversant  HEENT Atraumatic; Op clear with mmm.  Normo-cephalic. Pupils reactive. Anicteric sclerae  Thyroid/Neck Smooth without nodularity or enlargement. Normal ROM.  Neck Supple.  Skin No rashes, lesions or ulceration. Normal palpated skin turgor. No  nodularity.  Breasts: No masses or discharge.  Symmetric.  No axillary adenopathy.  Lungs: Clear to auscultation.No rales or wheezes. Normal Respiratory effort, no retractions.  Heart: NSR.  No  murmurs or rubs appreciated. No periferal edema  Abdomen: Soft.  Non-tender.  No masses.  No HSM. No hernia  Extremities: Moves all appropriately.  Normal ROM for age. No lymphadenopathy.  Neuro: Oriented to PPT.  Normal mood. Normal affect.     Pelvic:   Vulva: Normal appearance.  No lesions.  The 3 small areas in question seem to be very small sebaceous cysts.  Vagina: No lesions or abnormalities noted.  Support: Normal pelvic support.  Urethra No masses tenderness or scarring.  Meatus Normal size without lesions or prolapse.  Cervix: Normal appearance.  No lesions.  Moderate cervical stenosis  Anus: Normal exam.  No lesions.  Perineum: Normal exam.  No lesions.        Bimanual   Uterus:  Surgically absent  Adnexae: No masses.  Non-tender to palpation.  Cul-de-sac: Negative for abnormality.      Assessment:    G2P2 Patient Active Problem List   Diagnosis Date Noted  . Allergic reaction 10/23/2015  . Adiposity 10/23/2015  . Headache, migraine 10/23/2015  . 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 Allergic rhinitis 03/26/2010  . Asthma without status asthmaticus 01/31/2008  . Encounter for surveillance of contraceptive pills 10/06/2005     1. Women's annual routine gynecological examination   2. Sebaceous cyst     Asymptomatic vulvar sebaceous cyst   Plan:            1.  Basic Screening Recommendations The basic screening recommendations for asymptomatic women were discussed with the patient during her visit.  The age-appropriate recommendations were discussed with her and the rational for the tests reviewed.  When I am informed by the patient that another primary care physician has previously obtained the age-appropriate tests and they are up-to-date, only outstanding tests are ordered and referrals given as necessary.  Abnormal results of tests will be discussed with her when all of her results are completed. Pap performed-mammogram ordered-patient would like to put off blood work  until next year as last years was "normal". Orders Orders Placed This Encounter  Procedures  . MM DIGITAL SCREENING BILATERAL    No orders of the defined types were placed in this encounter.       F/U  Return in about 1 year (around 03/16/2019) for Annual Physical.  Finis Bud, M.D. 03/15/2018 9:53 AM

## 2018-03-15 NOTE — Progress Notes (Addendum)
Pt has 3 bumps on her vaginal do not know how long they have been there they do not hurt. Pt denies any itching and burning in the area.

## 2018-03-19 LAB — IGP, COBASHPV16/18
HPV 16: NEGATIVE
HPV 18: NEGATIVE
HPV other hr types: NEGATIVE
PAP Smear Comment: 0

## 2018-04-23 ENCOUNTER — Ambulatory Visit: Payer: BC Managed Care – PPO

## 2018-04-26 ENCOUNTER — Ambulatory Visit: Payer: BC Managed Care – PPO

## 2018-05-04 ENCOUNTER — Ambulatory Visit
Admission: RE | Admit: 2018-05-04 | Discharge: 2018-05-04 | Disposition: A | Discharge status: Home / Self Care | Payer: BC Managed Care – PPO | Source: Ambulatory Visit | Attending: Obstetrics and Gynecology | Admitting: Obstetrics and Gynecology

## 2018-05-04 DIAGNOSIS — Z01419 Encounter for gynecological examination (general) (routine) without abnormal findings: Secondary | ICD-10-CM | POA: Insufficient documentation

## 2019-02-20 ENCOUNTER — Other Ambulatory Visit: Payer: Self-pay | Admitting: Obstetrics and Gynecology

## 2019-02-20 DIAGNOSIS — Z1231 Encounter for screening mammogram for malignant neoplasm of breast: Secondary | ICD-10-CM

## 2019-03-19 ENCOUNTER — Encounter: Payer: BC Managed Care – PPO | Admitting: Obstetrics and Gynecology

## 2019-05-07 ENCOUNTER — Other Ambulatory Visit: Payer: Self-pay

## 2019-05-07 ENCOUNTER — Ambulatory Visit
Admission: RE | Admit: 2019-05-07 | Discharge: 2019-05-07 | Disposition: A | Discharge status: Home / Self Care | Payer: BC Managed Care – PPO | Source: Ambulatory Visit | Attending: Obstetrics and Gynecology | Admitting: Obstetrics and Gynecology

## 2019-05-07 DIAGNOSIS — Z1231 Encounter for screening mammogram for malignant neoplasm of breast: Secondary | ICD-10-CM | POA: Insufficient documentation

## 2019-06-03 ENCOUNTER — Telehealth: Payer: Self-pay

## 2019-06-03 NOTE — Telephone Encounter (Signed)
[facility] for prescreening.  

## 2019-06-03 NOTE — Telephone Encounter (Signed)
Pt prescreened no symptoms.   Coronavirus (COVID-19) Are you at risk?  Are you at risk for the Coronavirus (COVID-19)?  To be considered HIGH RISK for Coronavirus (COVID-19), you have to meet the following criteria:  . Traveled to [place], [place], [place], [place] or [place]; or in the [place] to [place], [place], [place], or [place]; and have fever, cough, and shortness of breath within the last 2 weeks of travel OR . Been in close contact with a person diagnosed with COVID-19 within the last 2 weeks and have fever, cough, and shortness of breath . IF YOU DO NOT MEET THESE CRITERIA, YOU ARE CONSIDERED LOW RISK FOR COVID-19.  What to do if you are HIGH RISK for COVID-19?  Marland Kitchen If you are having a medical emergency, call 911. . Seek medical care right away. Before you go to a doctor's office, urgent care or emergency department, call ahead and tell them about your recent travel, contact with someone diagnosed with COVID-19, and your symptoms. You should receive instructions from your physician's office regarding next steps of care.  . When you arrive at healthcare provider, tell the healthcare staff immediately you have returned from visiting [place], [place], [place], [place] or [place]; or traveled in the [place] to [place], [place], [place], or [place]; in the last two weeks or you have been in close contact with a person diagnosed with COVID-19 in the last 2 weeks.   . Tell the health care staff about your symptoms: fever, cough and shortness of breath. . After you have been seen by a medical provider, you will be either: o Tested for (COVID-19) and discharged home on quarantine except to seek medical care if symptoms worsen, and asked to  - Stay home and avoid contact with others until you get your results (4-5 days)  - Avoid travel on public transportation if possible (such as bus, train, or airplane) or o Sent to the Emergency Department by EMS for evaluation,  COVID-19 testing, and possible admission depending on your condition and test results.  What to do if you are LOW RISK for COVID-19?  Reduce your risk of any infection by using the same precautions used for avoiding the common cold or flu:  Marland Kitchen Wash your hands often with soap and warm water for at least 20 seconds.  If soap and water are not readily available, use an alcohol-based hand sanitizer with at least 60% alcohol.  . If coughing or sneezing, cover your mouth and nose by coughing or sneezing into the elbow areas of your shirt or coat, into a tissue or into your sleeve (not your hands). . Avoid shaking hands with others and consider head nods or verbal greetings only. . Avoid touching your eyes, nose, or mouth with unwashed hands.  . Avoid close contact with people who are sick. . Avoid places or events with large numbers of people in one location, like concerts or sporting events. . Carefully consider travel plans you have or are making. . If you are planning any travel outside or inside the [place], visit the CDC's Travelers' Health webpage for the latest health notices. . If you have some symptoms but not all symptoms, continue to monitor at home and seek medical attention if your symptoms worsen. . If you are having a medical emergency, call 911.   [facility] / e-Visit: [email or web address]         [facility]: [facility]: [facility]: [phone]

## 2019-06-04 ENCOUNTER — Ambulatory Visit (INDEPENDENT_AMBULATORY_CARE_PROVIDER_SITE_OTHER): Payer: BC Managed Care – PPO | Admitting: Obstetrics and Gynecology

## 2019-06-04 ENCOUNTER — Encounter: Payer: Self-pay | Admitting: Obstetrics and Gynecology

## 2019-06-04 ENCOUNTER — Other Ambulatory Visit: Payer: Self-pay

## 2019-06-04 VITALS — BP 136/80 | HR 83 | Ht 61.0 in | Wt 236.2 lb

## 2019-06-04 DIAGNOSIS — Z01419 Encounter for gynecological examination (general) (routine) without abnormal findings: Secondary | ICD-10-CM | POA: Diagnosis not present

## 2019-06-04 NOTE — Progress Notes (Signed)
Patient comes in today for her physical. She is due for her labs today. Mammogram done 05/07/2019. Pap one 03/2018.

## 2019-06-04 NOTE — Progress Notes (Signed)
HPI:      [name] is a 43 y.o. G2P2 who LMP was No LMP recorded. Patient has had a hysterectomy.  Subjective:   She presents today for her annual examination.  She states that she had another small labial cyst that enlarged and then drained but generally her sebaceous cyst did not cause her any problems.  She continues to experience monthly spotting consistent with endocervical gland bleeding at the time of menses.  She had a mammogram this year.  She plans to do her annual blood work this year.  She is not fasting today.    Hx: The following portions of the patient's history were reviewed and updated as appropriate:             She  has a past medical history of Abnormal uterine bleeding. She does not have any pertinent problems on file. She  has a past surgical history that includes Abdominal hysterectomy (2010); Mass excision (04/2001); Cesarean section (2002 & 2009); and Tonsillectomy (1996). Her family history includes Colon cancer in her paternal grandmother; Colon polyps in her father; Depression in her father; Diabetes in her father; Healthy in her brother and mother; Hypertension in her father. She  reports that she has never smoked. She has never used smokeless tobacco. She reports that she does not drink alcohol or use drugs. She has a current medication list which includes the following prescription(s): albuterol, epinephrine, fexofenadine, fluticasone, ibuprofen, and montelukast. She is allergic to fish allergy; milk protein; and wheat bran.       Review of Systems:  Review of Systems  Constitutional: Denied constitutional symptoms, night sweats, recent illness, fatigue, fever, insomnia and weight loss.  Eyes: Denied eye symptoms, eye pain, photophobia, vision change and visual disturbance.  Ears/Nose/Throat/Neck: Denied ear, nose, throat or neck symptoms, hearing loss, nasal discharge, sinus congestion and sore throat.  Cardiovascular: Denied cardiovascular symptoms,  arrhythmia, chest pain/pressure, edema, exercise intolerance, orthopnea and palpitations.  Respiratory: Denied pulmonary symptoms, asthma, pleuritic pain, productive sputum, cough, dyspnea and wheezing.  Gastrointestinal: Denied, gastro-esophageal reflux, melena, nausea and vomiting.  Genitourinary: Denied genitourinary symptoms including symptomatic vaginal discharge, pelvic relaxation issues, and urinary complaints.  Musculoskeletal: Denied musculoskeletal symptoms, stiffness, swelling, muscle weakness and myalgia.  Dermatologic: Denied dermatology symptoms, rash and scar.  Neurologic: Denied neurology symptoms, dizziness, headache, neck pain and syncope.  Psychiatric: Denied psychiatric symptoms, anxiety and depression.  Endocrine: Denied endocrine symptoms including hot flashes and night sweats.   Meds:   Current Outpatient Medications on File Prior to Visit  Medication Sig Dispense Refill  . albuterol (PROVENTIL HFA;VENTOLIN HFA) 108 (90 Base) MCG/ACT inhaler Inhale 2 puffs into the lungs every 6 (six) hours as needed for wheezing or shortness of breath. 1 Inhaler 2  . EPINEPHRINE 0.3 mg/0.3 mL IJ SOAJ injection AS DIRECTED 2 Device 0  . fexofenadine (ALLEGRA ALLERGY) 180 MG tablet Take by mouth.    . fluticasone (FLONASE) 50 MCG/ACT nasal spray Place into the nose.    . ibuprofen (ADVIL,MOTRIN) 200 MG tablet Take by mouth.    . montelukast (SINGULAIR) 10 MG tablet Take by mouth.     No current facility-administered medications on file prior to visit.     Objective:     Vitals:   06/04/19 0800  BP: 136/80  Pulse: 83              Physical examination General NAD, Conversant  HEENT Atraumatic; Op clear with mmm.  Normo-cephalic. Pupils reactive. Anicteric  sclerae  Thyroid/Neck Smooth without nodularity or enlargement. Normal ROM.  Neck Supple.  Skin No rashes, lesions or ulceration. Normal palpated skin turgor. No nodularity.  Breasts: No masses or discharge.  Symmetric.  No  axillary adenopathy.  Lungs: Clear to auscultation.No rales or wheezes. Normal Respiratory effort, no retractions.  Heart: NSR.  No murmurs or rubs appreciated. No periferal edema  Abdomen: Soft.  Non-tender.  No masses.  No HSM. No hernia  Extremities: Moves all appropriately.  Normal ROM for age. No lymphadenopathy.  Neuro: Oriented to PPT.  Normal mood. Normal affect.     Pelvic:   Vulva: Normal appearance.  No lesions.  Vagina: No lesions or abnormalities noted.  Support: Normal pelvic support.  Urethra No masses tenderness or scarring.  Meatus Normal size without lesions or prolapse.  Cervix: Normal appearance.  No lesions.  Stenosis  Anus: Normal exam.  No lesions.  Perineum: Normal exam.  No lesions.        Bimanual   Uterus:  Surgically absent  Adnexae: No masses.  Non-tender to palpation.  Cul-de-sac: Negative for abnormality.    Exam limited by patient body habitus  Assessment:    G2P2 Patient Active Problem List   Diagnosis Date Noted  . Allergic reaction 10/23/2015  . Adiposity 10/23/2015  . Headache, migraine 10/23/2015  . 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 Allergic rhinitis 03/26/2010  . Asthma without status asthmaticus 01/31/2008  . Encounter for surveillance of contraceptive pills 10/06/2005     1. Women's annual routine gynecological examination   2. Well woman exam with routine gynecological exam        Plan:            1.  Basic Screening Recommendations The basic screening recommendations for asymptomatic women were discussed with the patient during her visit.  The age-appropriate recommendations were discussed with her and the rational for the tests reviewed.  When I am informed by the patient that another primary care physician has previously obtained the age-appropriate tests and they are up-to-date, only outstanding tests are ordered and referrals given as necessary.  Abnormal results of tests will be discussed with her when all of her results are  completed. Lab work when fasting. Orders Orders Placed This Encounter  Procedures  . Hemoglobin A1c  . Lipid panel  . TSH    No orders of the defined types were placed in this encounter.       F/U  Return in about 1 year (around 06/03/2020) for Annual Physical.  Finis Bud, M.D. 06/04/2019 8:10 AM

## 2019-06-12 ENCOUNTER — Telehealth: Payer: Self-pay

## 2019-06-12 NOTE — Telephone Encounter (Signed)
Coronavirus (COVID-19) Are you at risk?  Are you at risk for the Coronavirus (COVID-19)?  To be considered HIGH RISK for Coronavirus (COVID-19), you have to meet the following criteria:  . Traveled to [place], [place], [place], [place] or [place]; or in the [place] to [place], [place], [place], or [place]; and have fever, cough, and shortness of breath within the last 2 weeks of travel OR . Been in close contact with a person diagnosed with COVID-19 within the last 2 weeks and have fever, cough, and shortness of breath . IF YOU DO NOT MEET THESE CRITERIA, YOU ARE CONSIDERED LOW RISK FOR COVID-19.  What to do if you are HIGH RISK for COVID-19?  . If you are having a medical emergency, call 911. . Seek medical care right away. Before you go to a doctor's office, urgent care or emergency department, call ahead and tell them about your recent travel, contact with someone diagnosed with COVID-19, and your symptoms. You should receive instructions from your physician's office regarding next steps of care.  . When you arrive at healthcare provider, tell the healthcare staff immediately you have returned from visiting [place], [place], [place], [place] or [place]; or traveled in the [place] to [place], [place], [place], or [place]; in the last two weeks or you have been in close contact with a person diagnosed with COVID-19 in the last 2 weeks.   . Tell the health care staff about your symptoms: fever, cough and shortness of breath. . After you have been seen by a medical provider, you will be either: o Tested for (COVID-19) and discharged home on quarantine except to seek medical care if symptoms worsen, and asked to  - Stay home and avoid contact with others until you get your results (4-5 days)  - Avoid travel on public transportation if possible (such as bus, train, or airplane) or o Sent to the Emergency Department by EMS for evaluation, COVID-19 testing, and possible  admission depending on your condition and test results.  What to do if you are LOW RISK for COVID-19?  Reduce your risk of any infection by using the same precautions used for avoiding the common cold or flu:  . Wash your hands often with soap and warm water for at least 20 seconds.  If soap and water are not readily available, use an alcohol-based hand sanitizer with at least 60% alcohol.  . If coughing or sneezing, cover your mouth and nose by coughing or sneezing into the elbow areas of your shirt or coat, into a tissue or into your sleeve (not your hands). . Avoid shaking hands with others and consider head nods or verbal greetings only. . Avoid touching your eyes, nose, or mouth with unwashed hands.  . Avoid close contact with people who are sick. . Avoid places or events with large numbers of people in one location, like concerts or sporting events. . Carefully consider travel plans you have or are making. . If you are planning any travel outside or inside the [place], visit the CDC's Travelers' Health webpage for the latest health notices. . If you have some symptoms but not all symptoms, continue to monitor at home and seek medical attention if your symptoms worsen. . If you are having a medical emergency, call 911.   ADDITIONAL HEALTHCARE OPTIONS FOR PATIENTS  [facility] / e-Visit: [email or web address]         [facility]: [phone]  [facility]: [facility]: [phone]  Prescreened neg cm

## 2019-06-13 ENCOUNTER — Other Ambulatory Visit: Payer: BC Managed Care – PPO

## 2019-06-13 ENCOUNTER — Other Ambulatory Visit: Payer: Self-pay

## 2019-06-13 DIAGNOSIS — Z01419 Encounter for gynecological examination (general) (routine) without abnormal findings: Secondary | ICD-10-CM

## 2019-06-14 LAB — TSH: TSH: 2.57 u[IU]/mL (ref 0.450–4.500)

## 2019-06-14 LAB — HEMOGLOBIN A1C
Est. average glucose Bld gHb Est-mCnc: 111 mg/dL
Hgb A1c MFr Bld: 5.5 % (ref 4.8–5.6)

## 2019-06-14 LAB — LIPID PANEL
Chol/HDL Ratio: 3.6 ratio (ref 0.0–4.4)
Cholesterol, Total: 153 mg/dL (ref 100–199)
HDL: 42 mg/dL (ref >39)
LDL Calculated: 98 mg/dL (ref 0–99)
Triglycerides: 65 mg/dL (ref 0–149)
VLDL Cholesterol Cal: 13 mg/dL (ref 5–40)

## 2020-03-26 ENCOUNTER — Other Ambulatory Visit: Payer: Self-pay | Admitting: Obstetrics and Gynecology

## 2020-03-26 DIAGNOSIS — Z1231 Encounter for screening mammogram for malignant neoplasm of breast: Secondary | ICD-10-CM

## 2020-05-12 ENCOUNTER — Other Ambulatory Visit: Payer: Self-pay

## 2020-05-12 ENCOUNTER — Ambulatory Visit
Admission: RE | Admit: 2020-05-12 | Discharge: 2020-05-12 | Disposition: A | Discharge status: Home / Self Care | Payer: BC Managed Care – PPO | Source: Ambulatory Visit | Attending: Obstetrics and Gynecology | Admitting: Obstetrics and Gynecology

## 2020-05-12 DIAGNOSIS — Z1231 Encounter for screening mammogram for malignant neoplasm of breast: Secondary | ICD-10-CM | POA: Diagnosis not present

## 2020-06-10 ENCOUNTER — Other Ambulatory Visit: Payer: Self-pay

## 2020-06-10 ENCOUNTER — Encounter: Payer: Self-pay | Admitting: Obstetrics and Gynecology

## 2020-06-10 ENCOUNTER — Ambulatory Visit (INDEPENDENT_AMBULATORY_CARE_PROVIDER_SITE_OTHER): Payer: BC Managed Care – PPO | Admitting: Obstetrics and Gynecology

## 2020-06-10 VITALS — BP 131/83 | HR 78 | Ht 61.0 in | Wt 230.4 lb

## 2020-06-10 DIAGNOSIS — Z01419 Encounter for gynecological examination (general) (routine) without abnormal findings: Secondary | ICD-10-CM | POA: Diagnosis not present

## 2020-06-10 NOTE — Progress Notes (Signed)
HPI:      Erica Cook is a 44 y.o. G2P2 who LMP was No LMP recorded. Patient has had a hysterectomy.  Subjective:   She presents today for her annual examination. She has no complaints or issues. Of significant note patient does have a supracervical hysterectomy and occasionally has very light monthly menses. She has had a mammogram this year. She is fasting for blood work.    Hx: The following portions of the patient's history were reviewed and updated as appropriate:             She  has a past medical history of Abnormal uterine bleeding. She does not have any pertinent problems on file. She  has a past surgical history that includes Abdominal hysterectomy (2010); Mass excision (04/2001); Cesarean section (2002 & 2009); and Tonsillectomy (1996). Her family history includes Colon cancer in her paternal grandmother; Colon polyps in her father; Depression in her father; Diabetes in her father; Healthy in her brother and mother; Hypertension in her father. She  reports that she has never smoked. She has never used smokeless tobacco. She reports that she does not drink alcohol and does not use drugs. She has a current medication list which includes the following prescription(s): albuterol, epinephrine, fexofenadine, fluticasone, ibuprofen, and montelukast. She is allergic to fish allergy, milk protein, and wheat bran.       Review of Systems:  Review of Systems  Constitutional: Denied constitutional symptoms, night sweats, recent illness, fatigue, fever, insomnia and weight loss.  Eyes: Denied eye symptoms, eye pain, photophobia, vision change and visual disturbance.  Ears/Nose/Throat/Neck: Denied ear, nose, throat or neck symptoms, hearing loss, nasal discharge, sinus congestion and sore throat.  Cardiovascular: Denied cardiovascular symptoms, arrhythmia, chest pain/pressure, edema, exercise intolerance, orthopnea and palpitations.  Respiratory: Denied pulmonary symptoms, asthma,  pleuritic pain, productive sputum, cough, dyspnea and wheezing.  Gastrointestinal: Denied, gastro-esophageal reflux, melena, nausea and vomiting.  Genitourinary: Denied genitourinary symptoms including symptomatic vaginal discharge, pelvic relaxation issues, and urinary complaints.  Musculoskeletal: Denied musculoskeletal symptoms, stiffness, swelling, muscle weakness and myalgia.  Dermatologic: Denied dermatology symptoms, rash and scar.  Neurologic: Denied neurology symptoms, dizziness, headache, neck pain and syncope.  Psychiatric: Denied psychiatric symptoms, anxiety and depression.  Endocrine: Denied endocrine symptoms including hot flashes and night sweats.   Meds:   Current Outpatient Medications on File Prior to Visit  Medication Sig Dispense Refill  . albuterol (PROVENTIL HFA;VENTOLIN HFA) 108 (90 Base) MCG/ACT inhaler Inhale 2 puffs into the lungs every 6 (six) hours as needed for wheezing or shortness of breath. 1 Inhaler 2  . EPINEPHRINE 0.3 mg/0.3 mL IJ SOAJ injection AS DIRECTED 2 Device 0  . fexofenadine (ALLEGRA ALLERGY) 180 MG tablet Take by mouth.    . fluticasone (FLONASE) 50 MCG/ACT nasal spray Place into the nose.    . ibuprofen (ADVIL,MOTRIN) 200 MG tablet Take by mouth.    . montelukast (SINGULAIR) 10 MG tablet Take by mouth.     No current facility-administered medications on file prior to visit.    Objective:     Vitals:   06/10/20 0735  BP: 131/83  Pulse: 78              Physical examination General NAD, Conversant  HEENT Atraumatic; Op clear with mmm.  Normo-cephalic. Pupils reactive. Anicteric sclerae  Thyroid/Neck Smooth without nodularity or enlargement. Normal ROM.  Neck Supple.  Skin No rashes, lesions or ulceration. Normal palpated skin turgor. No nodularity.  Breasts: No masses or  discharge.  Symmetric.  No axillary adenopathy.  Lungs: Clear to auscultation.No rales or wheezes. Normal Respiratory effort, no retractions.  Heart: NSR.  No murmurs  or rubs appreciated. No periferal edema  Abdomen: Soft.  Non-tender.  No masses.  No HSM. No hernia  Extremities: Moves all appropriately.  Normal ROM for age. No lymphadenopathy.  Neuro: Oriented to PPT.  Normal mood. Normal affect.     Pelvic:   Vulva: Normal appearance.  No lesions.  Vagina: No lesions or abnormalities noted.  Support: Normal pelvic support.  Urethra No masses tenderness or scarring.  Meatus Normal size without lesions or prolapse.  Cervix: Normal appearance.  No lesions. Cervical stenosis  Anus: Normal exam.  No lesions.  Perineum: Normal exam.  No lesions.        Bimanual   Uterus:  Surgically absent  Adnexae: No masses.  Non-tender to palpation.  Cul-de-sac: Negative for abnormality.      Assessment:    G2P2 Patient Active Problem List   Diagnosis Date Noted  . Allergic reaction 10/23/2015  . Adiposity 10/23/2015  . Headache, migraine 10/23/2015  . 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 Allergic rhinitis 03/26/2010  . Asthma without status asthmaticus 01/31/2008  . Encounter for surveillance of contraceptive pills 10/06/2005     1. Women's annual routine gynecological examination     Normal exam.   Plan:            1.  Basic Screening Recommendations The basic screening recommendations for asymptomatic women were discussed with the patient during her visit.  The age-appropriate recommendations were discussed with her and the rational for the tests reviewed.  When I am informed by the patient that another primary care physician has previously obtained the age-appropriate tests and they are up-to-date, only outstanding tests are ordered and referrals given as necessary.  Abnormal results of tests will be discussed with her when all of her results are completed.  Routine preventative health maintenance measures emphasized: Exercise/Diet/Weight control, Tobacco Warnings, Alcohol/Substance use risks and Stress Management Pap performed -blood work  ordered   Orders Orders Placed This Encounter  Procedures  . Hemoglobin A1c  . Lipid panel  . TSH    No orders of the defined types were placed in this encounter.       F/U  Return in about 1 year (around 06/10/2021) for Annual Physical.  Finis Bud, M.D. 06/10/2020 8:11 AM

## 2020-06-11 LAB — LIPID PANEL
Chol/HDL Ratio: 3.8 ratio (ref 0.0–4.4)
Cholesterol, Total: 175 mg/dL (ref 100–199)
HDL: 46 mg/dL (ref >39)
LDL Chol Calc (NIH): 115 mg/dL — ABNORMAL HIGH (ref 0–99)
Triglycerides: 74 mg/dL (ref 0–149)
VLDL Cholesterol Cal: 14 mg/dL (ref 5–40)

## 2020-06-11 LAB — TSH: TSH: 2.02 u[IU]/mL (ref 0.450–4.500)

## 2020-06-11 LAB — HEMOGLOBIN A1C
Est. average glucose Bld gHb Est-mCnc: 108 mg/dL
Hgb A1c MFr Bld: 5.4 % (ref 4.8–5.6)

## 2021-08-16 ENCOUNTER — Other Ambulatory Visit: Payer: Self-pay | Admitting: Obstetrics and Gynecology

## 2021-11-09 ENCOUNTER — Other Ambulatory Visit: Payer: Self-pay | Admitting: Student

## 2021-11-09 DIAGNOSIS — Z1231 Encounter for screening mammogram for malignant neoplasm of breast: Secondary | ICD-10-CM

## 2021-12-29 ENCOUNTER — Other Ambulatory Visit: Payer: Self-pay

## 2021-12-29 ENCOUNTER — Ambulatory Visit
Admission: RE | Admit: 2021-12-29 | Discharge: 2021-12-29 | Disposition: A | Discharge status: Home / Self Care | Payer: BC Managed Care – PPO | Source: Ambulatory Visit | Attending: Student | Admitting: Student

## 2021-12-29 DIAGNOSIS — Z1231 Encounter for screening mammogram for malignant neoplasm of breast: Secondary | ICD-10-CM | POA: Diagnosis present

## 2022-03-02 ENCOUNTER — Telehealth: Payer: BC Managed Care – PPO | Admitting: Physician Assistant

## 2022-03-02 DIAGNOSIS — B9689 Other specified bacterial agents as the cause of diseases classified elsewhere: Secondary | ICD-10-CM | POA: Diagnosis not present

## 2022-03-02 DIAGNOSIS — J019 Acute sinusitis, unspecified: Secondary | ICD-10-CM

## 2022-03-02 MED ORDER — AMOXICILLIN-POT CLAVULANATE 875-125 MG PO TABS: 1.0000 | ORAL_TABLET | Freq: Two times a day (BID) | ORAL | 0 refills | Status: AC

## 2022-03-02 MED ORDER — ALBUTEROL SULFATE HFA 108 (90 BASE) MCG/ACT IN AERS: 2.0000 | INHALATION_SPRAY | Freq: Four times a day (QID) | RESPIRATORY_TRACT | 0 refills | Status: AC | PRN

## 2022-03-02 NOTE — Progress Notes (Signed)
E-Visit for Sinus Problems ? ?We are sorry that you are not feeling well.  Here is how we plan to help! ? ?Based on what you have shared with me it looks like you have sinusitis.  Sinusitis is inflammation and infection in the sinus cavities of the head.  Based on your presentation I believe you most likely have Acute Bacterial Sinusitis.  This is an infection caused by bacteria and is treated with antibiotics. I have prescribed Augmentin '875mg'$ /'125mg'$  one tablet twice daily with food, for 7 days. You may use an oral decongestant such as Mucinex D or if you have glaucoma or high blood pressure use plain Mucinex. Saline nasal spray help and can safely be used as often as needed for congestion.  If you develop worsening sinus pain, fever or notice severe headache and vision changes, or if symptoms are not better after completion of antibiotic, please schedule an appointment with a health care provider.   ? ?I do see a history of asthma in your chart and an old rescue inhaler on your medication list. I know you mentioned breathing issues are mainly stemming from drainage and coughing but want to make sure you are not having any significant shortness of breath. Also want to make sure you have an in-date rescue inhaler at home. I appreciate any further input you can give me.  ? ?Sinus infections are not as easily transmitted as other respiratory infection, however we still recommend that you avoid close contact with loved ones, especially the very young and elderly.  Remember to wash your hands thoroughly throughout the day as this is the number one way to prevent the spread of infection! ? ?Home Care: ?Only take medications as instructed by your medical team. ?Complete the entire course of an antibiotic. ?Do not take these medications with alcohol. ?A steam or ultrasonic humidifier can help congestion.  You can place a towel over your head and breathe in the steam from hot water coming from a faucet. ?Avoid close contacts  especially the very young and the elderly. ?Cover your mouth when you cough or sneeze. ?Always remember to wash your hands. ? ?Get Help Right Away If: ?You develop worsening fever or sinus pain. ?You develop a severe head ache or visual changes. ?Your symptoms persist after you have completed your treatment plan. ? ?Make sure you ?Understand these instructions. ?Will watch your condition. ?Will get help right away if you are not doing well or get worse. ? ?Thank you for choosing an e-visit. ? ?Your e-visit answers were reviewed by a board certified advanced clinical practitioner to complete your personal care plan. Depending upon the condition, your plan could have included both over the counter or prescription medications. ? ?Please review your pharmacy choice. Make sure the pharmacy is open so you can pick up prescription now. If there is a problem, you may contact your provider through [employer] and have the prescription routed to another pharmacy.  Your safety is important to [place]. If you have drug allergies check your prescription carefully.  ? ?For the next 24 hours you can use MyChart to ask questions about today's visit, request a non-urgent call back, or ask for a work or school excuse. ?You will get an email in the next two days asking about your experience. I hope that your e-visit has been valuable and will speed your recovery. ? ?

## 2022-03-09 NOTE — Progress Notes (Signed)
I have spent 5 minutes in review of e-visit questionnaire, review and updating patient chart, medical decision making and response to patient.   Farhan Jean Cody Yuriko Portales, PA-C    

## 2022-05-26 ENCOUNTER — Telehealth: Payer: Self-pay

## 2022-05-26 ENCOUNTER — Other Ambulatory Visit: Payer: Self-pay

## 2022-05-26 DIAGNOSIS — Z1211 Encounter for screening for malignant neoplasm of colon: Secondary | ICD-10-CM

## 2022-05-26 DIAGNOSIS — Z8371 Family history of colonic polyps: Secondary | ICD-10-CM

## 2022-05-26 MED ORDER — NA SULFATE-K SULFATE-MG SULF 17.5-3.13-1.6 GM/177ML PO SOLN: 1.0000 | Freq: Once | ORAL | 0 refills | Status: AC

## 2022-05-26 NOTE — Progress Notes (Signed)
Gastroenterology Pre-Procedure Review  Request Date: 06/28/22 Requesting Physician: Dr. Allen Norris  PATIENT REVIEW QUESTIONS: The patient responded to the following health history questions as indicated:    1. Are you having any GI issues? no 2. Do you have a personal history of Polyps? no 3. Do you have a family history of Colon Cancer or Polyps? yes (father and paternal grandmother colon polyps) 4. Diabetes Mellitus? no 5. Joint replacements in the past 12 months?no 6. Major health problems in the past 3 months?no 7. Any artificial heart valves, MVP, or defibrillator?no    MEDICATIONS & ALLERGIES:    Patient reports the following regarding taking any anticoagulation/antiplatelet therapy:   Plavix, Coumadin, Eliquis, Xarelto, Lovenox, Pradaxa, Brilinta, or Effient? no Aspirin? no  Patient confirms/reports the following medications:  Current Outpatient Medications  Medication Sig Dispense Refill   albuterol (VENTOLIN HFA) 108 (90 Base) MCG/ACT inhaler Inhale 2 puffs into the lungs every 6 (six) hours as needed for wheezing or shortness of breath. 8 g 0   amoxicillin-clavulanate (AUGMENTIN) 875-125 MG tablet Take 1 tablet by mouth 2 (two) times daily. 14 tablet 0   EPINEPHRINE 0.3 mg/0.3 mL IJ SOAJ injection AS DIRECTED 2 Device 0   fexofenadine (ALLEGRA ALLERGY) 180 MG tablet Take by mouth.     fluticasone (FLONASE) 50 MCG/ACT nasal spray Place into the nose.     ibuprofen (ADVIL,MOTRIN) 200 MG tablet Take by mouth.     montelukast (SINGULAIR) 10 MG tablet Take by mouth.     No current facility-administered medications for this visit.    Patient confirms/reports the following allergies:  Allergies  Allergen Reactions   Fish Allergy Swelling   Milk Protein Swelling   Wheat Bran Swelling    No orders of the defined types were placed in this encounter.   AUTHORIZATION INFORMATION Primary Insurance: 1D#: Group #:  Secondary Insurance: 1D#: Group #:  SCHEDULE  INFORMATION: Date: 06/28/22 Time: Location: [place]

## 2022-05-26 NOTE — Telephone Encounter (Signed)
Gastroenterology Pre-Procedure Review  Request Date: 06/28/22 Requesting Physician: Dr. Servando Snare  PATIENT REVIEW QUESTIONS: The patient responded to the following health history questions as indicated:    1. Are you having any GI issues? no 2. Do you have a personal history of Polyps? no 3. Do you have a family history of Colon Cancer or Polyps? yes (Father, paternal grandmother colon polyps) 4. Diabetes Mellitus? no 5. Joint replacements in the past 12 months?no 6. Major health problems in the past 3 months?no 7. Any artificial heart valves, MVP, or defibrillator?no    MEDICATIONS & ALLERGIES:    Patient reports the following regarding taking any anticoagulation/antiplatelet therapy:   Plavix, Coumadin, Eliquis, Xarelto, Lovenox, Pradaxa, Brilinta, or Effient? no Aspirin? no  Patient confirms/reports the following medications:  Current Outpatient Medications  Medication Sig Dispense Refill   Na Sulfate-K Sulfate-Mg Sulf 17.5-3.13-1.6 GM/177ML SOLN Take 1 kit by mouth once for 1 dose. 354 mL 0   albuterol (VENTOLIN HFA) 108 (90 Base) MCG/ACT inhaler Inhale 2 puffs into the lungs every 6 (six) hours as needed for wheezing or shortness of breath. 8 g 0   amoxicillin-clavulanate (AUGMENTIN) 875-125 MG tablet Take 1 tablet by mouth 2 (two) times daily. 14 tablet 0   EPINEPHRINE 0.3 mg/0.3 mL IJ SOAJ injection AS DIRECTED 2 Device 0   fexofenadine (ALLEGRA ALLERGY) 180 MG tablet Take by mouth.     fluticasone (FLONASE) 50 MCG/ACT nasal spray Place into the nose.     ibuprofen (ADVIL,MOTRIN) 200 MG tablet Take by mouth.     montelukast (SINGULAIR) 10 MG tablet Take by mouth.     No current facility-administered medications for this visit.    Patient confirms/reports the following allergies:  Allergies  Allergen Reactions   Fish Allergy Swelling   Milk Protein Swelling   Wheat Bran Swelling    No orders of the defined types were placed in this encounter.   AUTHORIZATION  INFORMATION Primary Insurance: 1D#: Group #:  Secondary Insurance: 1D#: Group #:  SCHEDULE INFORMATION: Date: 06/28/22 Time: Location: [facility]

## 2022-05-26 NOTE — Telephone Encounter (Signed)
Procedure rescheduled to sooner date at [facility] with Dr. Vicente Males on 05/30/22.  Debbie at [facility] notified and [facility] Endo has been notified as well.  New instructions sent via mychart.  Thanks, [place], [place]

## 2022-05-27 ENCOUNTER — Encounter: Payer: Self-pay | Admitting: Gastroenterology

## 2022-05-30 ENCOUNTER — Ambulatory Visit: Payer: BC Managed Care – PPO | Admitting: Registered Nurse

## 2022-05-30 ENCOUNTER — Ambulatory Visit
Admission: RE | Admit: 2022-05-30 | Discharge: 2022-05-30 | Disposition: A | Discharge status: Home / Self Care | Payer: BC Managed Care – PPO | Attending: Gastroenterology | Admitting: Gastroenterology

## 2022-05-30 ENCOUNTER — Encounter
Admission: RE | Disposition: A | Discharge status: Home / Self Care | Payer: Self-pay | Source: Home / Self Care | Attending: Gastroenterology

## 2022-05-30 DIAGNOSIS — D128 Benign neoplasm of rectum: Secondary | ICD-10-CM | POA: Insufficient documentation

## 2022-05-30 DIAGNOSIS — Z90710 Acquired absence of both cervix and uterus: Secondary | ICD-10-CM | POA: Insufficient documentation

## 2022-05-30 DIAGNOSIS — K640 First degree hemorrhoids: Secondary | ICD-10-CM | POA: Diagnosis not present

## 2022-05-30 DIAGNOSIS — Z8371 Family history of colonic polyps: Secondary | ICD-10-CM

## 2022-05-30 DIAGNOSIS — Z1211 Encounter for screening for malignant neoplasm of colon: Secondary | ICD-10-CM | POA: Diagnosis present

## 2022-05-30 HISTORY — PX: COLONOSCOPY WITH PROPOFOL: SHX5780

## 2022-05-30 SURGERY — COLONOSCOPY WITH PROPOFOL
Anesthesia: General

## 2022-05-30 MED ORDER — DEXMEDETOMIDINE HCL 200 MCG/2ML IV SOLN
INTRAVENOUS | Status: DC | PRN
  Administered 2022-05-30: 20 ug via INTRAVENOUS

## 2022-05-30 MED ORDER — STERILE WATER FOR IRRIGATION IR SOLN
Status: DC | PRN
  Administered 2022-05-30: 60 mL

## 2022-05-30 MED ORDER — SODIUM CHLORIDE 0.9 % IV SOLN
INTRAVENOUS | Status: DC
  Administered 2022-05-30: 20 mL/h via INTRAVENOUS

## 2022-05-30 MED ORDER — PROPOFOL 500 MG/50ML IV EMUL
INTRAVENOUS | Status: DC | PRN
  Administered 2022-05-30: 175 ug/kg/min via INTRAVENOUS

## 2022-05-30 MED ORDER — PROPOFOL 10 MG/ML IV BOLUS
INTRAVENOUS | Status: DC | PRN
  Administered 2022-05-30: 100 mg via INTRAVENOUS

## 2022-05-30 NOTE — Transfer of Care (Signed)
Immediate Anesthesia Transfer of Care Note  Patient: Erica Cook  Procedure(s) Performed: COLONOSCOPY WITH PROPOFOL  Patient Location: Endoscopy Unit  Anesthesia Type:General  Level of Consciousness: drowsy  Airway & Oxygen Therapy: Patient Spontanous Breathing  Post-op Assessment: Report given to RN and Post -op Vital signs reviewed and stable  Post vital signs: Reviewed and stable  Last Vitals:  Vitals Value Taken Time  BP 107/72 05/30/22 1215  Temp    Pulse 79 05/30/22 1215  Resp 17 05/30/22 1215  SpO2 97 % 05/30/22 1215  Vitals shown include unvalidated device data.  Last Pain:  Vitals:   05/30/22 1050  TempSrc: Temporal  PainSc: 0-No pain         Complications: No notable events documented.

## 2022-05-30 NOTE — H&P (Signed)
Midge Minium, MD [facility] [address], [address] [place], [place] [address] Phone: [phone] Fax : [phone]  Primary Care Physician:  Etheleen Nicks, NP Primary Gastroenterologist:  Dr. Servando Snare  Pre-Procedure History & Physical: HPI:  Erica Cook is a 46 y.o. female is here for a screening colonoscopy.   Past Medical History:  Diagnosis Date   Abnormal uterine bleeding     Past Surgical History:  Procedure Laterality Date   ABDOMINAL HYSTERECTOMY  2010   CESAREAN SECTION  2002 & 2009   x 2   MASS EXCISION  04/2001   Upper Left Leg   TONSILLECTOMY  1996    Prior to Admission medications   Medication Sig Start Date End Date Taking? Authorizing Provider  albuterol (VENTOLIN HFA) 108 (90 Base) MCG/ACT inhaler Inhale 2 puffs into the lungs every 6 (six) hours as needed for wheezing or shortness of breath. 03/02/22  Yes Waldon Merl, PA-C  amoxicillin-clavulanate (AUGMENTIN) 875-125 MG tablet Take 1 tablet by mouth 2 (two) times daily. 03/02/22  Yes Waldon Merl, PA-C  EPINEPHRINE 0.3 mg/0.3 mL IJ SOAJ injection AS DIRECTED 10/18/16  Yes Pollak, Adriana M, PA-C  fexofenadine [facility] ALLERGY) 180 MG tablet Take by mouth.   Yes [provider]  fluticasone (FLONASE) 50 MCG/ACT nasal spray Place into the nose. 08/29/14  Yes [provider]  ibuprofen (ADVIL,MOTRIN) 200 MG tablet Take by mouth.   Yes [provider]  montelukast (SINGULAIR) 10 MG tablet Take by mouth. 03/17/14  Yes [provider]    Allergies as of 05/26/2022 - Review Complete 05/26/2022  Allergen Reaction Noted   Fish allergy Swelling 03/15/2018   Milk protein Swelling 10/23/2015   Wheat bran Swelling 10/23/2015    Family History  Problem Relation Age of Onset   Healthy Mother    Diabetes Father    Hypertension Father    Depression Father    Colon polyps Father    Healthy Brother    Colon cancer Paternal Grandmother        In her 41's   Breast cancer Neg Hx      Social History   Socioeconomic History   Marital status: Married    Spouse name: Not on file   Number of children: 2   Years of education: College   Highest education level: Not on file  Occupational History   Occupation: Full Time    Employer: [employer]  Tobacco Use   Smoking status: Never   Smokeless tobacco: Never  Vaping Use   Vaping Use: Never used  Substance and Sexual Activity   Alcohol use: No   Drug use: No   Sexual activity: Yes    Birth control/protection: Surgical  Other Topics Concern   Not on file  Social History Narrative   Not on file   Social Determinants of Health   Financial Resource Strain: Not on file  Food Insecurity: Not on file  Transportation Needs: Not on file  Physical Activity: Not on file  Stress: Not on file  Social Connections: Not on file  Intimate Partner Violence: Not on file    Review of Systems: See HPI, otherwise negative ROS  Physical Exam: BP 107/72   Pulse 87   Temp 98.2 F (36.8 C) (Temporal)   Resp 18   Ht 5' (1.524 m)   Wt 104.3 kg   SpO2 100%   BMI 44.92 kg/m  General:   Alert,  pleasant and cooperative in NAD Head:  Normocephalic  and atraumatic. Neck:  Supple; no masses or thyromegaly. Lungs:  Clear throughout to auscultation.    Heart:  Regular rate and rhythm. Abdomen:  Soft, nontender and nondistended. Normal bowel sounds, without guarding, and without rebound.   Neurologic:  Alert and  oriented x4;  grossly normal neurologically.  Impression/Plan: Erica Cook is now here to undergo a screening colonoscopy.  Risks, benefits, and alternatives regarding colonoscopy have been reviewed with the patient.  Questions have been answered.  All parties agreeable.

## 2022-05-30 NOTE — Anesthesia Preprocedure Evaluation (Signed)
Anesthesia Evaluation  Patient identified by MRN, date of birth, ID band Patient awake    Reviewed: Allergy & Precautions, NPO status , Patient's Chart, lab work & pertinent test results  History of Anesthesia Complications (+) Emergence Delirium and history of anesthetic complications ("cries when she wakes up")  Airway Mallampati: III  TM Distance: >3 FB Neck ROM: full    Dental  (+) Chipped   Pulmonary neg shortness of breath, asthma ,    Pulmonary exam normal        Cardiovascular Exercise Tolerance: Good negative cardio ROS Normal cardiovascular exam     Neuro/Psych  Headaches, negative psych ROS   GI/Hepatic negative GI ROS, Neg liver ROS, neg GERD  ,  Endo/Other  negative endocrine ROS  Renal/GU negative Renal ROS  negative genitourinary   Musculoskeletal   Abdominal   Peds  Hematology negative hematology ROS (+)   Anesthesia Other Findings Past Medical History: No date: Abnormal uterine bleeding  Past Surgical History: 2010: ABDOMINAL HYSTERECTOMY 2002 & 2009: CESAREAN SECTION     Comment:  x 2 04/2001: MASS EXCISION     Comment:  Upper Left Leg 1996: TONSILLECTOMY  BMI    Body Mass Index: 44.92 kg/m      Reproductive/Obstetrics negative OB ROS                             Anesthesia Physical Anesthesia Plan  ASA: 3  Anesthesia Plan: General   Post-op Pain Management:    Induction: Intravenous  PONV Risk Score and Plan: Propofol infusion and TIVA  Airway Management Planned: Natural Airway and Nasal Cannula  Additional Equipment:   Intra-op Plan:   Post-operative Plan:   Informed Consent: I have reviewed the patients History and Physical, chart, labs and discussed the procedure including the risks, benefits and alternatives for the proposed anesthesia with the patient or authorized representative who has indicated his/her understanding and acceptance.      Dental Advisory Given  Plan Discussed with: Anesthesiologist, CRNA and Surgeon  Anesthesia Plan Comments: (Patient consented for risks of anesthesia including but not limited to:  - adverse reactions to medications - risk of airway placement if required - damage to eyes, teeth, lips or other oral mucosa - nerve damage due to positioning  - sore throat or hoarseness - Damage to heart, brain, nerves, lungs, other parts of body or loss of life  Patient voiced understanding.)        Anesthesia Quick Evaluation

## 2022-05-31 ENCOUNTER — Encounter: Payer: Self-pay | Admitting: Gastroenterology

## 2022-05-31 LAB — SURGICAL PATHOLOGY

## 2022-06-02 ENCOUNTER — Encounter: Payer: Self-pay | Admitting: Gastroenterology

## 2023-08-14 ENCOUNTER — Telehealth: Payer: Self-pay

## 2023-08-14 NOTE — Telephone Encounter (Signed)
Pt lmovm requesting a call back regarding when her repeat colonoscopy is due   I called pt, no answer and lmovm

## 2024-03-27 ENCOUNTER — Other Ambulatory Visit: Payer: Self-pay | Admitting: Medical Genetics

## 2024-03-28 ENCOUNTER — Other Ambulatory Visit
Admission: RE | Admit: 2024-03-28 | Discharge: 2024-03-28 | Disposition: A | Discharge status: Home / Self Care | Payer: Self-pay | Source: Ambulatory Visit | Attending: Medical Genetics | Admitting: Medical Genetics

## 2024-04-09 LAB — GENECONNECT MOLECULAR SCREEN: Genetic Analysis Overall Interpretation: NEGATIVE
